# Patient Record
Sex: Male | Born: 1955
Health system: Southern US, Community
[De-identification: ages and names within clinical notes are randomized; demographics above are authoritative.]

## PROBLEM LIST (undated history)

## (undated) DIAGNOSIS — R7303 Prediabetes: Secondary | ICD-10-CM

## (undated) DIAGNOSIS — J349 Unspecified disorder of nose and nasal sinuses: Secondary | ICD-10-CM

## (undated) DIAGNOSIS — M1990 Unspecified osteoarthritis, unspecified site: Secondary | ICD-10-CM

## (undated) DIAGNOSIS — K219 Gastro-esophageal reflux disease without esophagitis: Secondary | ICD-10-CM

## (undated) DIAGNOSIS — I10 Essential (primary) hypertension: Secondary | ICD-10-CM

## (undated) DIAGNOSIS — Z860100 Personal history of colon polyps, unspecified: Secondary | ICD-10-CM

## (undated) DIAGNOSIS — K7460 Unspecified cirrhosis of liver: Secondary | ICD-10-CM

## (undated) DIAGNOSIS — F32A Depression, unspecified: Secondary | ICD-10-CM

## (undated) DIAGNOSIS — C801 Malignant (primary) neoplasm, unspecified: Secondary | ICD-10-CM

## (undated) DIAGNOSIS — E785 Hyperlipidemia, unspecified: Secondary | ICD-10-CM

## (undated) DIAGNOSIS — R519 Headache, unspecified: Secondary | ICD-10-CM

## (undated) DIAGNOSIS — F419 Anxiety disorder, unspecified: Secondary | ICD-10-CM

## (undated) DIAGNOSIS — J302 Other seasonal allergic rhinitis: Secondary | ICD-10-CM

## (undated) HISTORY — DX: Malignant (primary) neoplasm, unspecified: C80.1

## (undated) HISTORY — DX: Personal history of colon polyps, unspecified: Z86.0100

## (undated) HISTORY — DX: Unspecified osteoarthritis, unspecified site: M19.90

## (undated) HISTORY — DX: Gastro-esophageal reflux disease without esophagitis: K21.9

## (undated) HISTORY — DX: Hyperlipidemia, unspecified: E78.5

## (undated) HISTORY — DX: Essential (primary) hypertension: I10

## (undated) HISTORY — DX: Personal history of colonic polyps: Z86.010

## (undated) HISTORY — DX: Anxiety disorder, unspecified: F41.9

## (undated) HISTORY — DX: Unspecified disorder of nose and nasal sinuses: J34.9

## (undated) HISTORY — DX: Other seasonal allergic rhinitis: J30.2

---

## 1998-08-04 ENCOUNTER — Ambulatory Visit (HOSPITAL_COMMUNITY): Admission: RE | Admit: 1998-08-04 | Discharge: 1998-08-04 | Payer: Self-pay | Admitting: Family Medicine

## 1998-09-24 ENCOUNTER — Ambulatory Visit (HOSPITAL_COMMUNITY): Admission: RE | Admit: 1998-09-24 | Discharge: 1998-09-24 | Payer: Self-pay | Admitting: Family Medicine

## 2000-05-20 ENCOUNTER — Encounter: Admission: RE | Admit: 2000-05-20 | Discharge: 2000-05-20 | Payer: Self-pay | Admitting: Family Medicine

## 2000-05-20 ENCOUNTER — Encounter: Payer: Self-pay | Admitting: Family Medicine

## 2000-08-24 ENCOUNTER — Emergency Department (HOSPITAL_COMMUNITY): Admission: EM | Admit: 2000-08-24 | Discharge: 2000-08-24 | Payer: Self-pay

## 2000-11-04 ENCOUNTER — Encounter: Payer: Self-pay | Admitting: Family Medicine

## 2000-11-04 ENCOUNTER — Encounter: Admission: RE | Admit: 2000-11-04 | Discharge: 2000-11-04 | Payer: Self-pay | Admitting: Family Medicine

## 2002-01-16 ENCOUNTER — Ambulatory Visit (HOSPITAL_COMMUNITY): Admission: RE | Admit: 2002-01-16 | Discharge: 2002-01-16 | Payer: Self-pay | Admitting: Family Medicine

## 2002-01-16 ENCOUNTER — Encounter: Payer: Self-pay | Admitting: Family Medicine

## 2006-04-28 ENCOUNTER — Emergency Department (HOSPITAL_COMMUNITY): Admission: EM | Admit: 2006-04-28 | Discharge: 2006-04-28 | Payer: Self-pay | Admitting: Emergency Medicine

## 2007-02-08 ENCOUNTER — Encounter: Admission: RE | Admit: 2007-02-08 | Discharge: 2007-02-08 | Payer: Self-pay | Admitting: Family Medicine

## 2007-09-27 ENCOUNTER — Emergency Department (HOSPITAL_COMMUNITY): Admission: EM | Admit: 2007-09-27 | Discharge: 2007-09-27 | Payer: Self-pay | Admitting: Emergency Medicine

## 2007-09-27 IMAGING — CR DG RIBS W/ CHEST 3+V*L*
4 series · 4 of 4 positions shown · non-contrast
Comparison: none

HISTORY: Rib injury

[w chest pa]
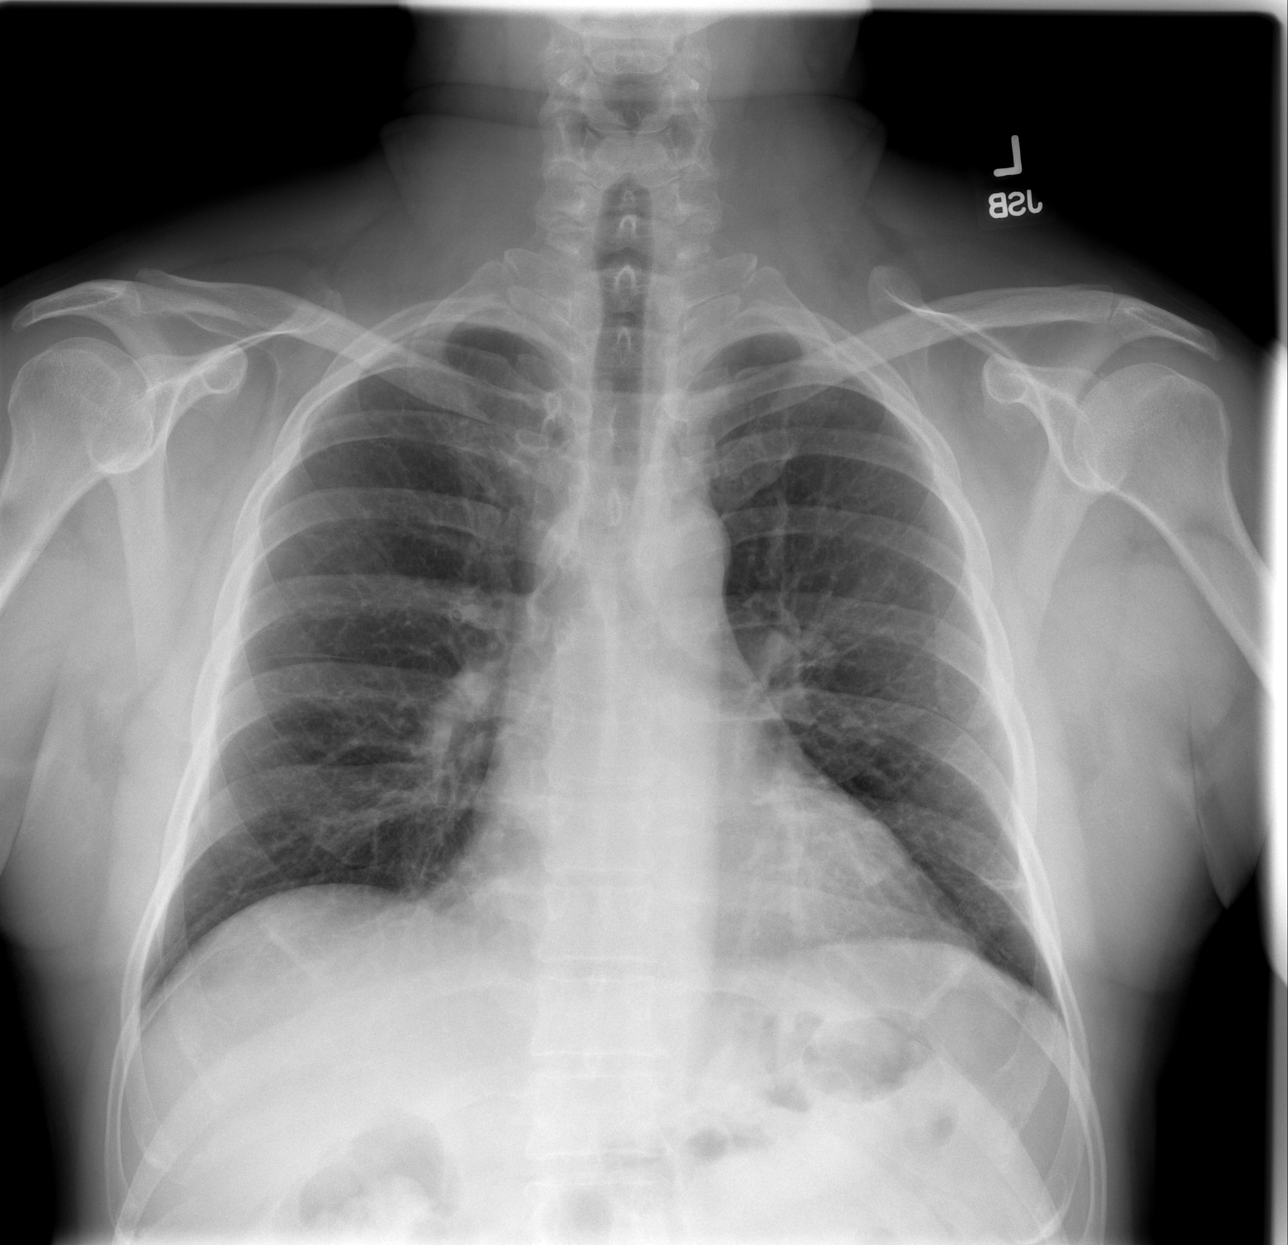

[w ribs ap/pa upper left]
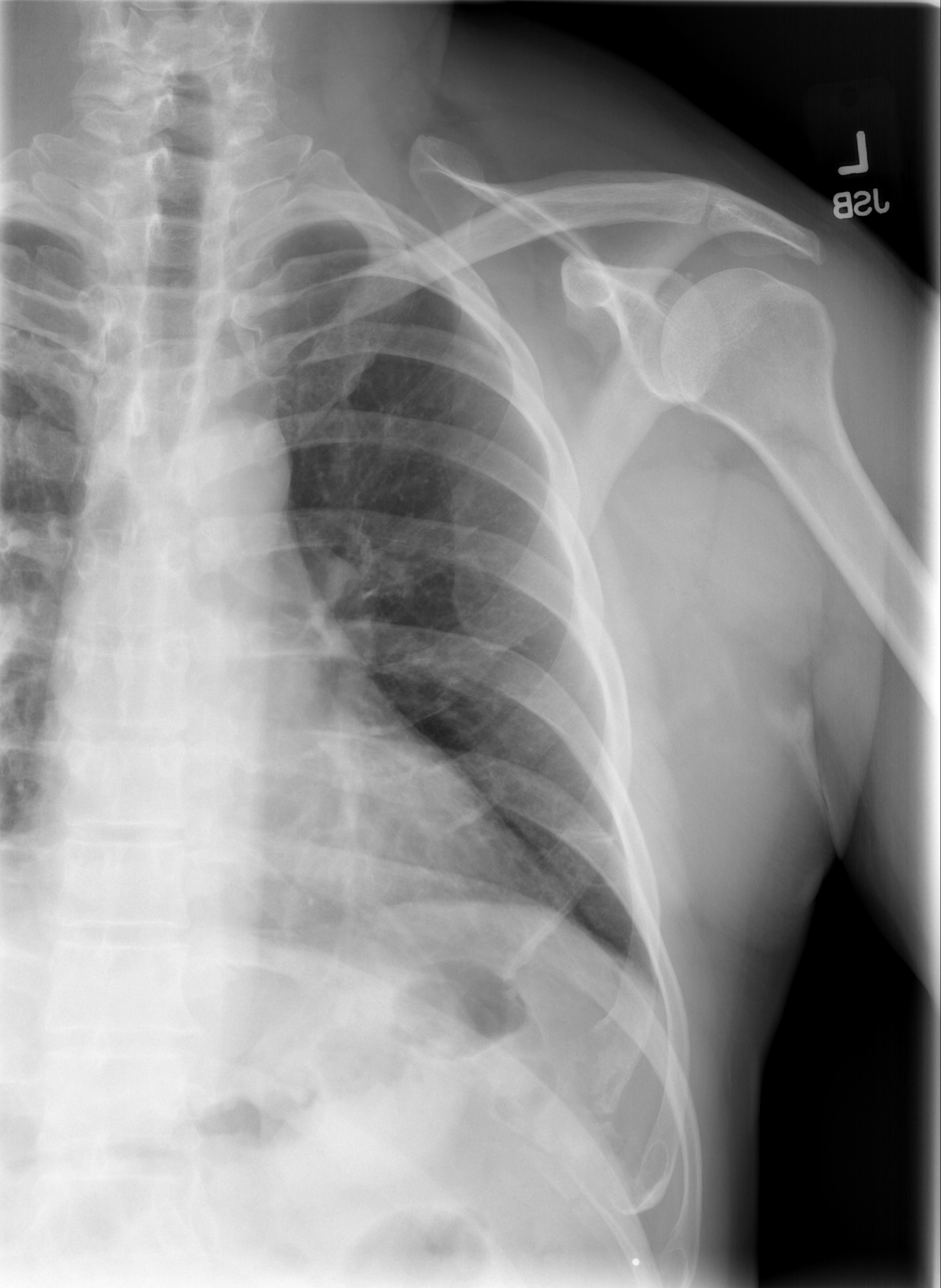

[w ribs oblique left]
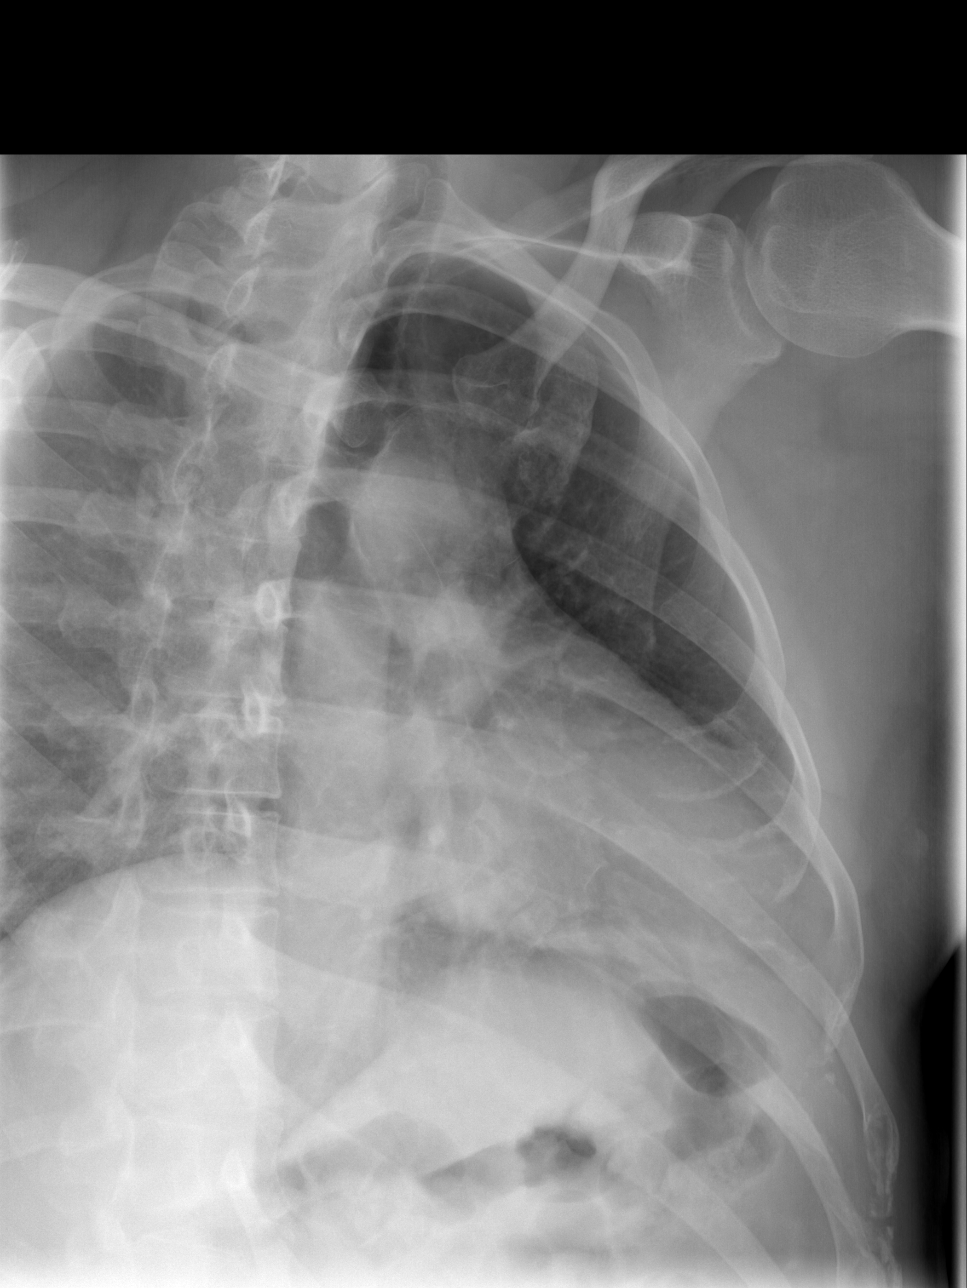

[w ribs ap/pa lower left]
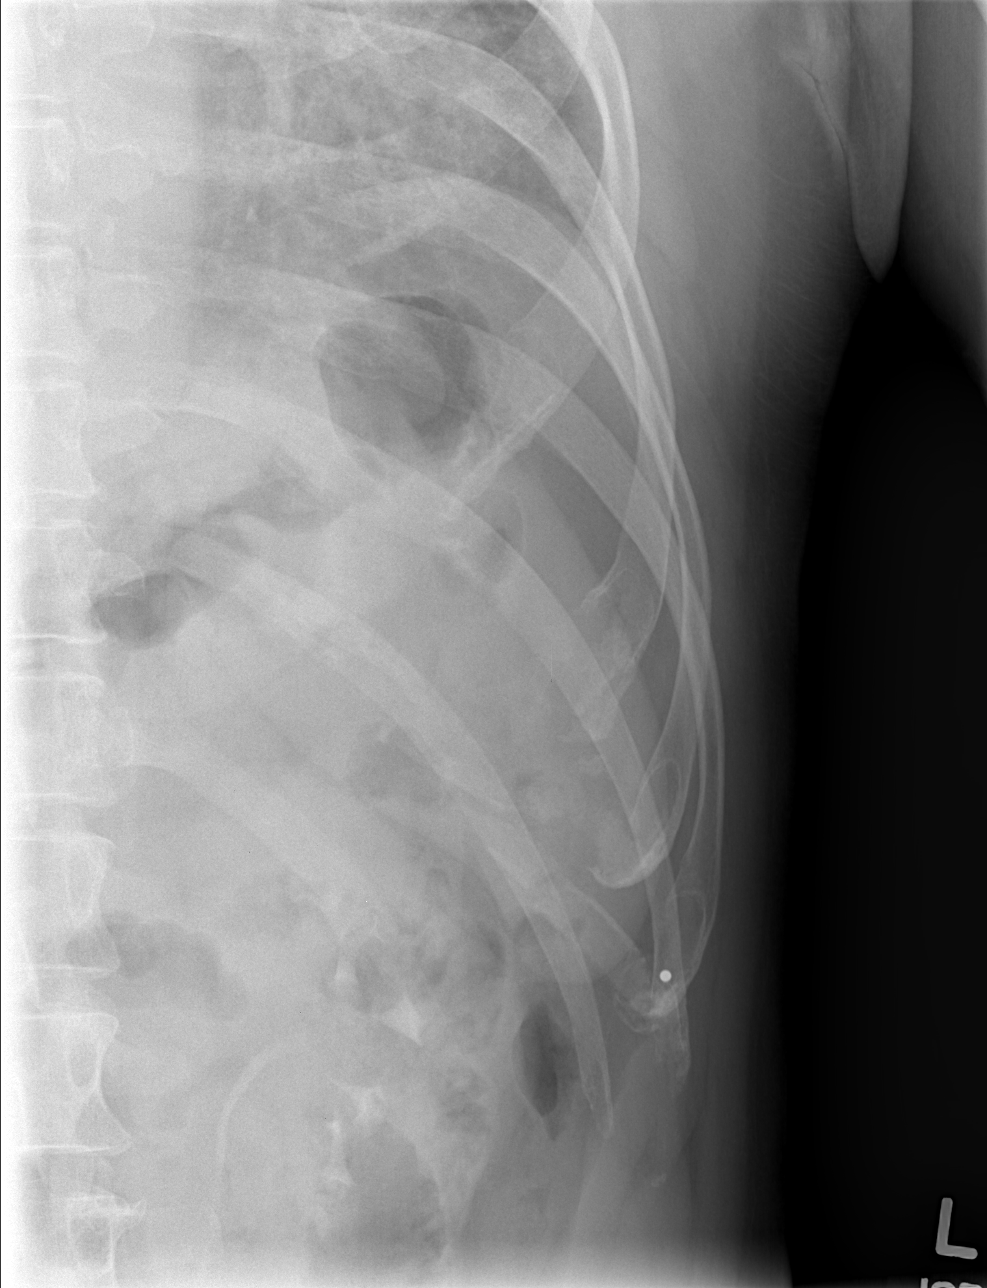

[4 of 4 positions shown; findings below may reference images not displayed]

LEFT RIBS WITH PA CHEST:

No prior exam for comparison.

Mild cardiac enlargement.
Normal mediastinal contours and pulmonary vascularity.
Minimal bronchitic changes with minimal subsegmental atelectasis left base.
No pulmonary infiltrate, pleural effusion, or pneumothorax.
Two views left ribs performed.
Nondisplaced fracture anterior left 9th rib, as noted on preceding CT.
No other bony abnormalities identified.
IMPRESSION: Nondisplaced fracture distal left 9th rib.
Minimal left basilar atelectasis and bronchitic changes.

## 2009-04-30 ENCOUNTER — Encounter (INDEPENDENT_AMBULATORY_CARE_PROVIDER_SITE_OTHER): Payer: Self-pay | Admitting: *Deleted

## 2009-04-30 ENCOUNTER — Emergency Department (HOSPITAL_COMMUNITY): Admission: EM | Admit: 2009-04-30 | Discharge: 2009-04-30 | Payer: Self-pay | Admitting: Emergency Medicine

## 2009-05-28 ENCOUNTER — Ambulatory Visit: Payer: Self-pay | Admitting: Gastroenterology

## 2009-05-28 DIAGNOSIS — I10 Essential (primary) hypertension: Secondary | ICD-10-CM | POA: Insufficient documentation

## 2009-05-28 DIAGNOSIS — M109 Gout, unspecified: Secondary | ICD-10-CM | POA: Insufficient documentation

## 2009-05-28 DIAGNOSIS — K5732 Diverticulitis of large intestine without perforation or abscess without bleeding: Secondary | ICD-10-CM | POA: Insufficient documentation

## 2009-05-29 ENCOUNTER — Ambulatory Visit: Payer: Self-pay | Admitting: Gastroenterology

## 2009-08-01 ENCOUNTER — Encounter: Payer: Self-pay | Admitting: Gastroenterology

## 2010-08-25 NOTE — Procedures (Signed)
Summary: Colonoscopy  Patient: Bobby Gamble Note: All result statuses are Final unless otherwise noted.  Tests: (1) Colonoscopy (COL)   COL Colonoscopy           DONE     [facility]     [address]     [place], [place]  [address]           COLONOSCOPY PROCEDURE REPORT           PATIENT:  Bobby Gamble, Bobby Gamble  MR#:  [id number]     BIRTHDATE:  Feb 19, 1956, 53 yrs. old  GENDER:  male           ENDOSCOPIST:  Barbette Hair. Arlyce Dice, MD     Referred by:           PROCEDURE DATE:  05/29/2009     PROCEDURE:  Colonoscopy, Diagnostic     ASA CLASS:  Class I     INDICATIONS:  diverticulitis           MEDICATIONS:   Fentanyl 100 mcg IV, Versed 13 mg IV, Benadryl 50     mg IV           DESCRIPTION OF PROCEDURE:   After the risks benefits and     alternatives of the procedure were thoroughly explained, informed     consent was obtained.  Digital rectal exam was performed and     revealed no abnormalities.   The LB CF-[id number] endoscope     was introduced through the anus and advanced to the cecum, which     was identified by both the appendix and ileocecal valve, without     limitations.  The quality of the prep was excellent, using     MoviPrep.  The instrument was then slowly withdrawn as the colon     was fully examined.     <<PROCEDUREIMAGES>>           FINDINGS:  Diverticula were found (see image9, image10, image11,     and image12). moderate diverticulosis in sigmoid  This was     otherwise a normal examination of the colon (see image2, image3,     image4, image5, image6, image7, image8, image13, image14, and     image15).   Retroflexed views in the rectum revealed no     abnormalities.    The scope was then withdrawn from the patient     and the procedure completed.           COMPLICATIONS:  None           ENDOSCOPIC IMPRESSION:     1) Diverticula     2) Otherwise normal examination     RECOMMENDATIONS:     1) Continue current colorectal screening  recommendations for     "routine risk" patients with a repeat colonoscopy in 10 years.           REPEAT EXAM:  In 10 year(s) for Colonoscopy.           ______________________________     Barbette Hair. Arlyce Dice, MD           CC:           n.     eSIGNED:   Barbette Hair. Curlee Bogan at 05/29/2009 04:14 PM           Sammuel Cooper, [id number]  Note: An exclamation mark (!) indicates a result that was not dispersed into the flowsheet. Document Creation Date: 05/29/2009 4:14 PM _______________________________________________________________________  (1) Order  result status: Final Collection or observation date-time: 05/29/2009 15:55 Requested date-time:  Receipt date-time:  Reported date-time:  Referring Physician:   Ordering Physician: Melvia Heaps [phone]) Specimen Source:  Source: Launa Grill Order Number: [id number] Lab site:   Appended Document: Colonoscopy    Clinical Lists Changes  Observations: Added new observation of COLONNXTDUE: 05/2019 (05/29/2009 16:19)

## 2010-08-25 NOTE — Assessment & Plan Note (Signed)
Summary: DIVERTICULITIES....EM   History of Present Illness Primary GI MD: Melvia Heaps MD [facility] Chief Complaint: F/u diverticulitis flare up. Pt is currently taking ATBs for flare. Pt has RLQ abd pain and constipation. Consult colon. History of Present Illness:   Bobby Gamble is a pleasant 55 year old white male referred from the ER for evaluation of abdominal pain.  In early October he presented to the ER with severe crampy lower abdominal pain.  CT Scan, which I reviewed, demonstrated inflammation in  the fat plane surrounding the sigmoid colon consistent with acute diverticulitis.  On Cipro and Flagyl his symptoms subsided though he did have significant anorexia and mild nausea with antibiotics.  He had a similar episode of abdominal pain one to 2 years ago.  In between he has felt well.  There is no history of rectal bleeding or change in bowel habits.   GI Review of Systems    Reports abdominal pain, acid reflux, belching, and  bloating.     Location of  Abdominal pain: RLQ.    Denies chest pain, dysphagia with liquids, dysphagia with solids, heartburn, loss of appetite, nausea, vomiting, vomiting blood, weight loss, and  weight gain.      Reports constipation, diverticulosis, and  hemorrhoids.     Denies anal fissure, black tarry stools, change in bowel habit, diarrhea, fecal incontinence, heme positive stool, irritable bowel syndrome, jaundice, light color stool, liver problems, rectal bleeding, and  rectal pain. Preventive Screening-Counseling & Management  Alcohol-Tobacco     Smoking Status: never      Drug Use:  no.      Current Medications (verified): 1)  Allopurinol 100 Mg Tabs (Allopurinol) .... One Tablet By Mouth Once Daily 2)  Nexium 40 Mg Cpdr (Esomeprazole Magnesium) .... One Tablet By Mouth Once Daily 3)  Lipofen 50 Mg Caps (Fenofibrate) .... One Tablet By Mouth Once Daily 4)  Lisinopril 10 Mg Tabs (Lisinopril) .... One Tablet By Mouth Once Daily 5)  Xanax 0.5 Mg  Tabs (Alprazolam) .... One Tablet By Mouth As Needed 6)  Temazepam 15 Mg Caps (Temazepam) .... One Tablet By Mouth At Bedtime  Allergies (verified): 1)  ! 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  Alcohol Use - yes Daily Caffeine Use Illicit Drug Use - no Smoking Status:  never Drug Use:  no  Review of Systems       The patient complains of anxiety-new and cough.  The patient denies allergy/sinus, anemia, arthritis/joint pain, back pain, blood in urine, breast changes/lumps, change in vision, confusion, coughing up blood, depression-new, fainting, fatigue, fever, headaches-new, hearing problems, heart murmur, heart rhythm changes, itching, menstrual pain, muscle pains/cramps, night sweats, nosebleeds, pregnancy symptoms, shortness of breath, skin rash, sleeping problems, sore throat, swelling of feet/legs, swollen lymph glands, thirst - excessive , urination - excessive , urination changes/pain, urine leakage, vision changes, and voice change.         All other systems were reviewed and were negative   Vital Signs:  Patient profile:   55 year old male Height:      66 inches Weight:      178 pounds BMI:     28.83 Pulse rate:   72 / minute Pulse rhythm:   regular BP sitting:   136 / 90  (left arm) Cuff size:   regular  Vitals Entered By: Bobby Gamble CMA (AAMA) (May 28, 2009 11:07 AM)  Physical Exam  Additional Exam:  He is a healthy-appearing male  skin: anicteric HEENT: normocephalic; PEERLA; no nasal or pharyngeal abnormalities neck: supple nodes: no cervical lymphadenopathy chest: clear to ausculatation and percussion heart: no murmurs, gallops, or rubs abd: soft, nontender; BS normoactive; no abdominal masses,  tenderness, organomegaly rectal: deferred ext: no cynanosis, clubbing, edema skeletal: no deformities neuro: oriented x 3; no focal abnormalities    Impression & Recommendations:  Problem # 1:  DIVERTICULITIS OF COLON (ICD-562.11) Assessment Improved  Patient has had 2 discrete episodes of diverticulitis.  We began a conversation about potential elective surgery if diverticulitis should recur.  Colonoscopy was recommended both for further evaluation and for colorectal cancer screening.  Orders: Colonoscopy (Colon)  Problem # 2:  GOUT, UNSPECIFIED (ICD-274.9) Assessment: Comment Only  Problem # 3:  ESSENTIAL HYPERTENSION, BENIGN (ICD-401.1) Assessment: Comment Only  Patient Instructions: 1)  Constipation and Hemorrhoids brochure given.  2)  Colonoscopy and Flexible Sigmoidoscopy brochure given.  3)  Your colonoscopy is scheduled for 05/29/2009 at 3pm 4)  You can pick up your MoviPrep from your pharmacy today 5)  cc Dr. Hulan Saas 6)  The medication list was reviewed and reconciled.  All changed / newly prescribed medications were explained.  A complete medication list was provided to the patient / caregiver. Prescriptions: MOVIPREP 100 GM  SOLR (PEG-KCL-NACL-NASULF-NA ASC-C) As per prep instructions.  #1 x 0   Entered by:   Bobby Gamble CMA (AAMA)   Authorized by:   Louis Meckel MD   Signed by:   Bobby Gamble CMA (AAMA) on 05/28/2009   Method used:   Electronically to        [facility]  [address] (retail)       [address]       [place], [place]  [address]       Ph: [phone] or [phone]       Fax: [phone]   RxID:   [id number]

## 2010-08-25 NOTE — Letter (Signed)
Summary: [facility]   Imported By: Sherian Rein 09/26/2009 12:03:49  _____________________________________________________________________  External Attachment:    Type:   Image     Comment:   External Document

## 2010-08-25 NOTE — Letter (Signed)
Summary: Results Letter  [facility]  [address] [place], [place] [address]   Phone: [phone]  Fax: [phone]        May 28, 2009 MRN: [id number]    Bobby Gamble [address] [place], [place]  [address]    Dear [name],  It is my pleasure to have treated you recently as a new patient in my office. I appreciate your confidence and the opportunity to participate in your care.  Since I do have a busy inpatient endoscopy schedule and office schedule, my office hours vary weekly. I am, however, available for emergency calls everyday through my office. If I am not available for an urgent office appointment, another one of our gastroenterologist will be able to assist you.  My well-trained staff are prepared to help you at all times. For emergencies after office hours, a physician from our Gastroenterology section is always available through my 24 hour answering service  Once again I welcome you as a new patient and I look forward to a happy and healthy relationship             Sincerely,  Louis Meckel MD  This letter has been electronically signed by your physician.  Appended Document: Results Letter Letter mailed to patient.

## 2010-08-25 NOTE — Letter (Signed)
Summary: [facility] Instructions  [facility]  [address] [place], [place] [address]   Phone: [phone]  Fax: [phone]       Bobby Gamble    05/22/1956    MRN: [id number]        Procedure Day /Date:THURSDAY 05/29/2009     Arrival Time:2:00PM     Procedure Time:3:00PM     Location of Procedure:                    X  [facility] (4th Floor)                        PREPARATION FOR COLONOSCOPY WITH MOVIPREP   Starting 5 days prior to your procedure TODAY do not eat nuts, seeds, popcorn, corn, beans, peas,  salads, or any raw vegetables.  Do not take any fiber supplements (e.g. Metamucil, Citrucel, and Benefiber).  THE DAY BEFORE YOUR PROCEDURE         DATE: 05/28/2009  ONG:EXBMWUXLK  1.  Drink clear liquids the entire day-NO SOLID FOOD  2.  Do not drink anything colored red or purple.  Avoid juices with pulp.  No orange juice.  3.  Drink at least 64 oz. (8 glasses) of fluid/clear liquids during the day to prevent dehydration and help the prep work efficiently.  CLEAR LIQUIDS INCLUDE: Water Jello Ice Popsicles Tea (sugar ok, no milk/cream) Powdered fruit flavored drinks Coffee (sugar ok, no milk/cream) Gatorade Juice: apple, white grape, white cranberry  Lemonade Clear bullion, consomm, broth Carbonated beverages (any kind) Strained chicken noodle soup Hard Candy                             4.  In the morning, mix first dose of MoviPrep solution:    Empty 1 Pouch A and 1 Pouch B into the disposable container    Add lukewarm drinking water to the top line of the container. Mix to dissolve    Refrigerate (mixed solution should be used within 24 hrs)  5.  Begin drinking the prep at 5:00 p.m. The MoviPrep container is divided by 4 marks.   Every 15 minutes drink the solution down to the next mark (approximately 8 oz) until the full liter is complete.   6.  Follow completed prep with 16 oz of clear liquid of your choice (Nothing  red or purple).  Continue to drink clear liquids until bedtime.  7.  Before going to bed, mix second dose of MoviPrep solution:    Empty 1 Pouch A and 1 Pouch B into the disposable container    Add lukewarm drinking water to the top line of the container. Mix to dissolve    Refrigerate  THE DAY OF YOUR PROCEDURE      DATE: 05/29/2009 DAY: THURSDAY  Beginning at 10:00a.m. (5 hours before procedure):         1. Every 15 minutes, drink the solution down to the next mark (approx 8 oz) until the full liter is complete.  2. Follow completed prep with 16 oz. of clear liquid of your choice.    3. You may drink clear liquids until 1:PM (2 HOURS BEFORE PROCEDURE).   MEDICATION INSTRUCTIONS  Unless otherwise instructed, you should take regular prescription medications with a small sip of water   as early as possible the morning of your procedure.  OTHER INSTRUCTIONS  You will need a responsible adult at least 53 years of age to accompany you and drive you home.   This person must remain in the waiting room during your procedure.  Wear loose fitting clothing that is easily removed.  Leave jewelry and other valuables at home.  However, you may wish to bring a book to read or  an iPod/MP3 player to listen to music as you wait for your procedure to start.  Remove all body piercing jewelry and leave at home.  Total time from sign-in until discharge is approximately 2-3 hours.  You should go home directly after your procedure and rest.  You can resume normal activities the  day after your procedure.  The day of your procedure you should not:   Drive   Make legal decisions   Operate machinery   Drink alcohol   Return to work  You will receive specific instructions about eating, activities and medications before you leave.    The above instructions have been reviewed and explained to me by   _______________________    I fully understand and can verbalize  these instructions _____________________________ Date _________

## 2010-10-29 LAB — POCT I-STAT, CHEM 8
BUN: 17 mg/dL (ref 6–23)
Calcium, Ion: 1.18 mmol/L (ref 1.12–1.32)
Chloride: 106 mEq/L (ref 96–112)
Creatinine, Ser: 1.3 mg/dL (ref 0.4–1.5)
Glucose, Bld: 117 mg/dL — ABNORMAL HIGH (ref 70–99)
HCT: 45 % (ref 39.0–52.0)
Hemoglobin: 15.3 g/dL (ref 13.0–17.0)
Potassium: 4.3 mEq/L (ref 3.5–5.1)
Sodium: 140 mEq/L (ref 135–145)
TCO2: 23 mmol/L (ref 0–100)

## 2010-10-29 LAB — CBC
HCT: 43.6 % (ref 39.0–52.0)
Hemoglobin: 15.3 g/dL (ref 13.0–17.0)
MCHC: 35.2 g/dL (ref 30.0–36.0)
MCV: 99.7 fL (ref 78.0–100.0)
Platelets: 188 10*3/uL (ref 150–400)
RBC: 4.37 MIL/uL (ref 4.22–5.81)
RDW: 12.3 % (ref 11.5–15.5)
WBC: 14.4 10*3/uL — ABNORMAL HIGH (ref 4.0–10.5)

## 2010-10-29 LAB — URINALYSIS, ROUTINE W REFLEX MICROSCOPIC
Bilirubin Urine: NEGATIVE
Glucose, UA: NEGATIVE mg/dL
Hgb urine dipstick: NEGATIVE
Ketones, ur: NEGATIVE mg/dL
Nitrite: NEGATIVE
Protein, ur: NEGATIVE mg/dL
Specific Gravity, Urine: 1.019 (ref 1.005–1.030)
Urobilinogen, UA: 0.2 mg/dL (ref 0.0–1.0)
pH: 7.5 (ref 5.0–8.0)

## 2010-10-29 LAB — DIFFERENTIAL
Basophils Absolute: 0 10*3/uL (ref 0.0–0.1)
Basophils Relative: 0 % (ref 0–1)
Eosinophils Absolute: 0.1 10*3/uL (ref 0.0–0.7)
Eosinophils Relative: 1 % (ref 0–5)
Lymphocytes Relative: 17 % (ref 12–46)
Lymphs Abs: 2.4 10*3/uL (ref 0.7–4.0)
Monocytes Absolute: 1 10*3/uL (ref 0.1–1.0)
Monocytes Relative: 7 % (ref 3–12)
Neutro Abs: 10.9 10*3/uL — ABNORMAL HIGH (ref 1.7–7.7)
Neutrophils Relative %: 75 % (ref 43–77)

## 2010-10-29 LAB — BASIC METABOLIC PANEL
BUN: 15 mg/dL (ref 6–23)
CO2: 25 mEq/L (ref 19–32)
Calcium: 9.5 mg/dL (ref 8.4–10.5)
Chloride: 104 mEq/L (ref 96–112)
Creatinine, Ser: 1.36 mg/dL (ref 0.4–1.5)
GFR calc Af Amer: >60 mL/min (ref >60)
GFR calc non Af Amer: 55 mL/min — ABNORMAL LOW (ref >60)
Glucose, Bld: 119 mg/dL — ABNORMAL HIGH (ref 70–99)
Potassium: 4.2 mEq/L (ref 3.5–5.1)
Sodium: 138 mEq/L (ref 135–145)

## 2011-04-14 ENCOUNTER — Ambulatory Visit (HOSPITAL_BASED_OUTPATIENT_CLINIC_OR_DEPARTMENT_OTHER)
Admission: RE | Admit: 2011-04-14 | Discharge: 2011-04-14 | Disposition: A | Discharge status: Home / Self Care | Payer: 59 | Source: Ambulatory Visit | Attending: Urology | Admitting: Urology

## 2011-04-14 ENCOUNTER — Other Ambulatory Visit: Payer: Self-pay | Admitting: Urology

## 2011-04-14 DIAGNOSIS — R972 Elevated prostate specific antigen [PSA]: Secondary | ICD-10-CM | POA: Insufficient documentation

## 2011-04-14 DIAGNOSIS — C61 Malignant neoplasm of prostate: Secondary | ICD-10-CM | POA: Insufficient documentation

## 2011-04-14 DIAGNOSIS — Z01810 Encounter for preprocedural cardiovascular examination: Secondary | ICD-10-CM | POA: Insufficient documentation

## 2011-04-14 DIAGNOSIS — Z01812 Encounter for preprocedural laboratory examination: Secondary | ICD-10-CM | POA: Insufficient documentation

## 2011-04-14 DIAGNOSIS — I10 Essential (primary) hypertension: Secondary | ICD-10-CM | POA: Insufficient documentation

## 2011-04-14 DIAGNOSIS — K219 Gastro-esophageal reflux disease without esophagitis: Secondary | ICD-10-CM | POA: Insufficient documentation

## 2011-04-14 LAB — POCT I-STAT 4, (NA,K, GLUC, HGB,HCT)
Glucose, Bld: 116 mg/dL — ABNORMAL HIGH (ref 70–99)
HCT: 44 % (ref 39.0–52.0)
Hemoglobin: 15 g/dL (ref 13.0–17.0)
Potassium: 4.3 mEq/L (ref 3.5–5.1)
Sodium: 141 mEq/L (ref 135–145)

## 2011-04-15 NOTE — Op Note (Signed)
  Bobby Gamble, CLAUSON            ACCOUNT NO.:  [id number]  MEDICAL RECORD NO.:  [id number]  LOCATION:                               FACILITY:  [facility]  PHYSICIAN:  Valetta Fuller, MD    DATE OF BIRTH:  18-Feb-1956  DATE OF PROCEDURE: DATE OF DISCHARGE:                              OPERATIVE REPORT   PREOPERATIVE DIAGNOSIS:  Elevated PSA.  POSTOPERATIVE DIAGNOSIS:  Elevated PSA.  PROCEDURE PERFORMED:  Transrectal ultrasound of the prostate with ultrasound-guided biopsy x12.  SURGEON:  Valetta Fuller, MD  ANESTHESIA:  General.  INDICATIONS:  [name] is 55 years of age.  He was sent to see me for a PSA that has been steadily rising.  The patient's PSA in 2011 was 2.5.  In May of this year his PSA increased to 4.7 and was 5.3 when checked in August.  At that point, he was sent to [place] for further assessment.  Rectal exam revealed a small unremarkable prostate.  The patient had considerable discomfort from a rectal exam and also considerable anxiety.  For that reason, when we discussed the rationale for consideration of ultrasound biopsy of the prostate, the patient requested additional anesthetic rather than just office procedure.  We felt that given his degree of discomfort and anxiety that was quite a reasonable request.  The patient has performed Fleets enemas and has taken oral Levaquin.  He has received perioperative gentamicin in the holding area.  Full informed consent has been obtained.  TECHNIQUE AND FINDINGS:  The patient was brought to the operating room where he had successful induction of general anesthesia.  He was placed in lateral decubitus position.  Transrectal ultrasound probe was placed. Representative pictures in the transverse and sagittal dimensions were taken.  The prostate was measured at 15 g.  The prostate and seminal vesicles were symmetric and unremarkable.  There were no other abnormalities appreciated.  Twelve biopsies were done in the  standard manner with each core sent in a separate vial.  There were no obvious complications or problems and the patient was brought to recovery room in stable condition.     Valetta Fuller, MD     DSG/MEDQ  D:  04/14/2011  T:  04/14/2011  Job:  [id number]  Electronically Signed by Barron Alvine M.D. on 04/15/2011 04:54:37 PM

## 2011-04-19 LAB — CBC
HCT: 47.3
Hemoglobin: 16.8
MCHC: 35.4
MCV: 96.8
Platelets: 208
RBC: 4.89
RDW: 11.7
WBC: 8.9

## 2011-04-19 LAB — HEPATIC FUNCTION PANEL
ALT: 51
AST: 32
Albumin: 4.4
Alkaline Phosphatase: 63
Bilirubin, Direct: 0.2
Indirect Bilirubin: 0.6
Total Bilirubin: 0.8
Total Protein: 7.8

## 2011-04-19 LAB — I-STAT 8, (EC8 V) (CONVERTED LAB)
Acid-Base Excess: 3 — ABNORMAL HIGH
BUN: 17
Bicarbonate: 29.2 — ABNORMAL HIGH
Chloride: 106
Glucose, Bld: 96
HCT: 49
Hemoglobin: 16.7
Operator id: 234501
Potassium: 4.5
Sodium: 137
TCO2: 31
pCO2, Ven: 50.7 — ABNORMAL HIGH
pH, Ven: 7.368 — ABNORMAL HIGH

## 2011-04-19 LAB — POCT I-STAT CREATININE
Creatinine, Ser: 1.4
Operator id: 234501

## 2011-04-19 LAB — DIFFERENTIAL
Basophils Absolute: 0
Basophils Relative: 1
Eosinophils Absolute: 0.3
Eosinophils Relative: 4
Lymphocytes Relative: 34
Lymphs Abs: 3
Monocytes Absolute: 0.8
Monocytes Relative: 9
Neutro Abs: 4.8
Neutrophils Relative %: 54

## 2011-04-19 LAB — LIPASE, BLOOD: Lipase: 41

## 2011-05-31 ENCOUNTER — Encounter (HOSPITAL_COMMUNITY): Payer: Self-pay | Admitting: Pharmacy Technician

## 2011-06-01 ENCOUNTER — Encounter (HOSPITAL_COMMUNITY): Payer: Self-pay

## 2011-06-01 ENCOUNTER — Ambulatory Visit (HOSPITAL_COMMUNITY)
Admission: RE | Admit: 2011-06-01 | Discharge: 2011-06-01 | Disposition: A | Discharge status: Home / Self Care | Payer: 59 | Source: Ambulatory Visit | Attending: Urology | Admitting: Urology

## 2011-06-01 ENCOUNTER — Encounter (HOSPITAL_COMMUNITY): Payer: 59

## 2011-06-01 DIAGNOSIS — F419 Anxiety disorder, unspecified: Secondary | ICD-10-CM

## 2011-06-01 DIAGNOSIS — C801 Malignant (primary) neoplasm, unspecified: Secondary | ICD-10-CM

## 2011-06-01 DIAGNOSIS — K219 Gastro-esophageal reflux disease without esophagitis: Secondary | ICD-10-CM

## 2011-06-01 DIAGNOSIS — Z01812 Encounter for preprocedural laboratory examination: Secondary | ICD-10-CM | POA: Insufficient documentation

## 2011-06-01 DIAGNOSIS — E785 Hyperlipidemia, unspecified: Secondary | ICD-10-CM

## 2011-06-01 DIAGNOSIS — M1990 Unspecified osteoarthritis, unspecified site: Secondary | ICD-10-CM

## 2011-06-01 DIAGNOSIS — Z01818 Encounter for other preprocedural examination: Secondary | ICD-10-CM | POA: Insufficient documentation

## 2011-06-01 DIAGNOSIS — J349 Unspecified disorder of nose and nasal sinuses: Secondary | ICD-10-CM

## 2011-06-01 HISTORY — DX: Gastro-esophageal reflux disease without esophagitis: K21.9

## 2011-06-01 HISTORY — DX: Anxiety disorder, unspecified: F41.9

## 2011-06-01 HISTORY — DX: Unspecified disorder of nose and nasal sinuses: J34.9

## 2011-06-01 HISTORY — DX: Unspecified osteoarthritis, unspecified site: M19.90

## 2011-06-01 HISTORY — DX: Hyperlipidemia, unspecified: E78.5

## 2011-06-01 HISTORY — PX: WISDOM TOOTH EXTRACTION: SHX21

## 2011-06-01 HISTORY — PX: MANDIBLE FRACTURE SURGERY: SHX706

## 2011-06-01 HISTORY — DX: Malignant (primary) neoplasm, unspecified: C80.1

## 2011-06-01 LAB — BASIC METABOLIC PANEL
BUN: 22 mg/dL (ref 6–23)
CO2: 27 mEq/L (ref 19–32)
Calcium: 10.4 mg/dL (ref 8.4–10.5)
Chloride: 101 mEq/L (ref 96–112)
Creatinine, Ser: 1.3 mg/dL (ref 0.50–1.35)
GFR calc Af Amer: 71 mL/min — ABNORMAL LOW (ref >90)
GFR calc non Af Amer: 61 mL/min — ABNORMAL LOW (ref >90)
Glucose, Bld: 101 mg/dL — ABNORMAL HIGH (ref 70–99)
Potassium: 4.1 mEq/L (ref 3.5–5.1)
Sodium: 138 mEq/L (ref 135–145)

## 2011-06-01 LAB — CBC
HCT: 39.9 % (ref 39.0–52.0)
Hemoglobin: 14.5 g/dL (ref 13.0–17.0)
MCH: 33.8 pg (ref 26.0–34.0)
MCHC: 36.3 g/dL — ABNORMAL HIGH (ref 30.0–36.0)
MCV: 93 fL (ref 78.0–100.0)
Platelets: 191 10*3/uL (ref 150–400)
RBC: 4.29 MIL/uL (ref 4.22–5.81)
RDW: 12.7 % (ref 11.5–15.5)
WBC: 7.9 10*3/uL (ref 4.0–10.5)

## 2011-06-01 LAB — SURGICAL PCR SCREEN
MRSA, PCR: INVALID — AB
Staphylococcus aureus: INVALID — AB

## 2011-06-01 NOTE — Pre-Procedure Instructions (Signed)
EKG 04-14-11 with chart in epic. CXR to be done 06-01-11.

## 2011-06-01 NOTE — Patient Instructions (Signed)
20 Bobby Gamble  06/01/2011   Your procedure is scheduled on: 06-09-11  Report to [facility] at 0630 AM.  Call this number if you have problems the morning of surgery: [phone]   Remember:   Do not eat food:After Midnight.  Do not drink clear liquids: After Midnight.  Take these medicines the morning of surgery with A SIP OF WATER: Prilosec, Allopurinol, Lipofen,Xanax.   Do not wear jewelry, make-up or nail polish.  Do not wear lotions, powders, or perfumes. You may wear deodorant.  Do not shave 48 hours prior to surgery.  Do not bring valuables to the hospital.  Contacts, dentures or bridgework may not be worn into surgery.  Leave suitcase in the car. After surgery it may be brought to your room.  For patients admitted to the hospital, checkout time is 11:00 AM the day of discharge.   Patients discharged the day of surgery will not be allowed to drive home.  Name and phone number of your driver: Dianne XBMWUXLK,[phone]  Special Instructions: CHG Shower Use Special Wash: 1/2 bottle night before surgery and 1/2 bottle morning of surgery.   Please read over the following fact sheets that you were given: Blood Transfusion Information and MRSA Information. Pt. Reminded of home bowel prep per Dr. Ellin Goodie office.

## 2011-06-04 LAB — MRSA CULTURE

## 2011-06-08 NOTE — H&P (Signed)
Urology Admission H&P  Chief Complaint:Prostate cancer for RLRRP/ BPLND.  History of Present Illness: The patient presents today for robotic-assisted laparoscopic radical retropubic prostatectomy and bilateral pelvic lymph node dissection. This has been diagnosed with intermediate risk clinical stage TI C. adenocarcinoma of the prostate. Patient was asymptomatic and an elevated PSA of 5.3. Digital rectal exam was unremarkable. Ultrasound revealed about a 15 g prostate. Also biopsies were negative. On the right 5/6 biopsy cores were positive for Gleason's 3+4 equals 7 cancer.  Past Medical History  Diagnosis Date  . Hypertension   . Sinus disorder 06-01-11    allergies chronic/ sinus issues  . GERD (gastroesophageal reflux disease) 06-01-11    tx. Prilosec  . Cancer 06-01-11    Bx.  with dx. 5 weeks ago-Prostate   . Arthritis 06-01-11    hx. Gout-tx. Allopurinol  . Anxiety 06-01-11    tx. Xanax  . Hyperlipidemia 06-01-11   Past Surgical History  Procedure Date  . Mandible fracture surgery 06-01-11    Rt. lower jaw-retained hardware.  . Wisdom tooth extraction 06-01-11    all    Home Medications:  No prescriptions prior to admission   Allergies:  Allergies  Allergen Reactions  . Codeine Nausea And Vomiting    No family history on file. Social History:  does not have a smoking history on file. He does not have any smokeless tobacco history on file. He reports that he drinks about 3.6 ounces of alcohol per week. His drug history not on file.  Review of Systems  Genitourinary: Positive for frequency.  Musculoskeletal: Positive for back pain.  Psychiatric/Behavioral: The patient is nervous/anxious.   All other systems reviewed and are negative.    Physical Exam:  Vital signs in last 24 hours:   Physical Exam  Constitutional: He is oriented to person, place, and time. He appears well-developed and well-nourished.  HENT:  Head: Normocephalic.  Neck: Neck supple.    Cardiovascular: Normal rate and regular rhythm.   Respiratory: Effort normal and breath sounds normal. No respiratory distress.  GI: Soft. He exhibits no distension. There is no tenderness.  Genitourinary: Rectum normal, prostate normal and penis normal.  Musculoskeletal: Normal range of motion.  Neurological: He is alert and oriented to person, place, and time.  Skin: Skin is warm and dry.    Laboratory Data:  No results found for this or any previous visit (from the past 24 hour(s)). Recent Results (from the past 240 hour(s))  SURGICAL PCR SCREEN     Status: Abnormal   Collection Time   06/01/11  3:47 PM      Component Value Range Status Comment   MRSA, PCR INVALID RESULTS, SPECIMEN SENT FOR CULTURE (*) NEGATIVE  Final    Staphylococcus aureus INVALID RESULTS, SPECIMEN SENT FOR CULTURE (*) NEGATIVE  Final   MRSA CULTURE     Status: Normal   Collection Time   06/01/11  3:55 PM      Component Value Range Status Comment   Specimen Description NOSE   Final    Special Requests NONE   Final    Culture NO STAPHYLOCOCCUS AUREUS ISOLATED   Final    Report Status 06/04/2011 FINAL   Final    Creatinine: No results found for this basename: CREATININE:7 in the last 168 hours Baseline Creatinine:   Impression/Assessment:  Prostate cancer  Plan:  RLRRP/ B PLND  Melissa Tomaselli S 06/08/2011, 8:37 PM

## 2011-06-09 ENCOUNTER — Encounter (HOSPITAL_COMMUNITY): Payer: Self-pay | Admitting: *Deleted

## 2011-06-09 ENCOUNTER — Encounter (HOSPITAL_COMMUNITY): Payer: Self-pay | Admitting: Anesthesiology

## 2011-06-09 ENCOUNTER — Other Ambulatory Visit: Payer: Self-pay | Admitting: Urology

## 2011-06-09 ENCOUNTER — Inpatient Hospital Stay (HOSPITAL_COMMUNITY): Payer: 59 | Admitting: Anesthesiology

## 2011-06-09 ENCOUNTER — Inpatient Hospital Stay (HOSPITAL_COMMUNITY)
Admission: RE | Admit: 2011-06-09 | Discharge: 2011-06-10 | DRG: 708 | Disposition: A | Discharge status: Home / Self Care | Payer: 59 | Source: Ambulatory Visit | Attending: Urology | Admitting: Urology

## 2011-06-09 ENCOUNTER — Encounter (HOSPITAL_COMMUNITY)
Admission: RE | Disposition: A | Discharge status: Home / Self Care | Payer: Self-pay | Source: Ambulatory Visit | Attending: Urology

## 2011-06-09 DIAGNOSIS — I10 Essential (primary) hypertension: Secondary | ICD-10-CM | POA: Diagnosis present

## 2011-06-09 DIAGNOSIS — E785 Hyperlipidemia, unspecified: Secondary | ICD-10-CM | POA: Diagnosis present

## 2011-06-09 DIAGNOSIS — K219 Gastro-esophageal reflux disease without esophagitis: Secondary | ICD-10-CM | POA: Diagnosis present

## 2011-06-09 DIAGNOSIS — C61 Malignant neoplasm of prostate: Principal | ICD-10-CM | POA: Diagnosis present

## 2011-06-09 DIAGNOSIS — F411 Generalized anxiety disorder: Secondary | ICD-10-CM | POA: Diagnosis present

## 2011-06-09 DIAGNOSIS — M129 Arthropathy, unspecified: Secondary | ICD-10-CM | POA: Diagnosis present

## 2011-06-09 HISTORY — PX: ROBOT ASSISTED LAPAROSCOPIC RADICAL PROSTATECTOMY: SHX5141

## 2011-06-09 LAB — HEMOGLOBIN AND HEMATOCRIT, BLOOD
HCT: 35.3 % — ABNORMAL LOW (ref 39.0–52.0)
Hemoglobin: 12.8 g/dL — ABNORMAL LOW (ref 13.0–17.0)

## 2011-06-09 LAB — TYPE AND SCREEN
ABO/RH(D): O POS
Antibody Screen: NEGATIVE

## 2011-06-09 LAB — ABO/RH: ABO/RH(D): O POS

## 2011-06-09 SURGERY — ROBOTIC ASSISTED LAPAROSCOPIC RADICAL PROSTATECTOMY
Anesthesia: General | Site: Abdomen | Laterality: Bilateral | Wound class: Clean Contaminated

## 2011-06-09 MED ORDER — EPHEDRINE SULFATE 50 MG/ML IJ SOLN
INTRAMUSCULAR | Status: DC | PRN
  Administered 2011-06-09: 10 mg via INTRAVENOUS

## 2011-06-09 MED ORDER — LIDOCAINE HCL (CARDIAC) 20 MG/ML IV SOLN
INTRAVENOUS | Status: DC | PRN
  Administered 2011-06-09: 60 mg via INTRAVENOUS

## 2011-06-09 MED ORDER — LACTATED RINGERS IV SOLN
INTRAVENOUS | Status: DC | PRN
  Administered 2011-06-09 (×2): via INTRAVENOUS

## 2011-06-09 MED ORDER — GLYCOPYRROLATE 0.2 MG/ML IJ SOLN
INTRAMUSCULAR | Status: DC | PRN
  Administered 2011-06-09: .6 mg via INTRAVENOUS

## 2011-06-09 MED ORDER — INDIGOTINDISULFONATE SODIUM 8 MG/ML IJ SOLN
INTRAMUSCULAR | Status: DC | PRN
  Administered 2011-06-09 (×2): 5 mL via INTRAVENOUS

## 2011-06-09 MED ORDER — ONDANSETRON HCL 4 MG/2ML IJ SOLN
INTRAMUSCULAR | Status: AC
  Filled 2011-06-09: qty 2

## 2011-06-09 MED ORDER — SODIUM CHLORIDE 0.9 % IV SOLN
1.5000 g | Freq: Once | INTRAVENOUS | Status: AC
  Administered 2011-06-09: 1.5 g via INTRAVENOUS

## 2011-06-09 MED ORDER — HEMOSTATIC AGENTS (NO CHARGE) OPTIME
TOPICAL | Status: DC | PRN
  Administered 2011-06-09: 1

## 2011-06-09 MED ORDER — SODIUM CHLORIDE 0.9 % IR SOLN
Status: DC | PRN
  Administered 2011-06-09: 400 mL

## 2011-06-09 MED ORDER — ACETAMINOPHEN 10 MG/ML IV SOLN
1000.0000 mg | Freq: Four times a day (QID) | INTRAVENOUS | Status: AC
  Administered 2011-06-09 – 2011-06-10 (×4): 1000 mg via INTRAVENOUS
  Filled 2011-06-09 (×6): qty 100

## 2011-06-09 MED ORDER — BISACODYL 10 MG RE SUPP: 10.0000 mg | Freq: Every day | RECTAL | Status: DC | PRN

## 2011-06-09 MED ORDER — HYDROMORPHONE HCL PF 1 MG/ML IJ SOLN
0.2500 mg | INTRAMUSCULAR | Status: DC | PRN
  Administered 2011-06-09 (×2): 0.5 mg via INTRAVENOUS

## 2011-06-09 MED ORDER — ACETAMINOPHEN 325 MG PO TABS: 650.0000 mg | ORAL_TABLET | ORAL | Status: DC | PRN

## 2011-06-09 MED ORDER — ALPRAZOLAM 0.5 MG PO TABS
0.5000 mg | ORAL_TABLET | Freq: Every day | ORAL | Status: DC
  Administered 2011-06-09 – 2011-06-10 (×2): 0.5 mg via ORAL
  Filled 2011-06-09: qty 1

## 2011-06-09 MED ORDER — CEFAZOLIN SODIUM 1-5 GM-% IV SOLN
1.0000 g | Freq: Three times a day (TID) | INTRAVENOUS | Status: AC
  Administered 2011-06-09 (×2): 1 g via INTRAVENOUS
  Filled 2011-06-09 (×2): qty 50

## 2011-06-09 MED ORDER — PANTOPRAZOLE SODIUM 40 MG PO TBEC
40.0000 mg | DELAYED_RELEASE_TABLET | Freq: Every day | ORAL | Status: DC
  Administered 2011-06-10: 40 mg via ORAL
  Filled 2011-06-09: qty 1

## 2011-06-09 MED ORDER — FENTANYL CITRATE 0.05 MG/ML IJ SOLN
INTRAMUSCULAR | Status: DC | PRN
  Administered 2011-06-09 (×2): 50 ug via INTRAVENOUS
  Administered 2011-06-09: 100 ug via INTRAVENOUS
  Administered 2011-06-09: 50 ug via INTRAVENOUS
  Administered 2011-06-09: 100 ug via INTRAVENOUS

## 2011-06-09 MED ORDER — NEOSTIGMINE METHYLSULFATE 1 MG/ML IJ SOLN
INTRAMUSCULAR | Status: DC | PRN
  Administered 2011-06-09: 4 mg via INTRAVENOUS

## 2011-06-09 MED ORDER — BELLADONNA ALKALOIDS-OPIUM 16.2-60 MG RE SUPP: 1.0000 | Freq: Four times a day (QID) | RECTAL | Status: DC | PRN

## 2011-06-09 MED ORDER — HYDROMORPHONE HCL PF 1 MG/ML IJ SOLN
0.2500 mg | INTRAMUSCULAR | Status: DC | PRN
  Administered 2011-06-09: 0.5 mg via INTRAVENOUS

## 2011-06-09 MED ORDER — HYDROCODONE-ACETAMINOPHEN 5-325 MG PO TABS
1.0000 | ORAL_TABLET | ORAL | Status: DC | PRN
  Filled 2011-06-09: qty 2

## 2011-06-09 MED ORDER — ROCURONIUM BROMIDE 100 MG/10ML IV SOLN
INTRAVENOUS | Status: DC | PRN
  Administered 2011-06-09: 10 mg via INTRAVENOUS
  Administered 2011-06-09: 50 mg via INTRAVENOUS
  Administered 2011-06-09 (×2): 10 mg via INTRAVENOUS

## 2011-06-09 MED ORDER — BACITRACIN-NEOMYCIN-POLYMYXIN 400-5-5000 EX OINT: 1.0000 "application " | TOPICAL_OINTMENT | Freq: Three times a day (TID) | CUTANEOUS | Status: DC | PRN

## 2011-06-09 MED ORDER — ACETAMINOPHEN 10 MG/ML IV SOLN
1000.0000 mg | Freq: Four times a day (QID) | INTRAVENOUS | Status: DC
  Filled 2011-06-09 (×5): qty 100

## 2011-06-09 MED ORDER — MEPERIDINE HCL 50 MG/ML IJ SOLN: 6.2500 mg | INTRAMUSCULAR | Status: DC | PRN

## 2011-06-09 MED ORDER — ONDANSETRON HCL 4 MG/2ML IJ SOLN
4.0000 mg | INTRAMUSCULAR | Status: DC | PRN
  Administered 2011-06-09 (×2): 4 mg via INTRAVENOUS
  Filled 2011-06-09: qty 2

## 2011-06-09 MED ORDER — ZOLPIDEM TARTRATE 5 MG PO TABS: 5.0000 mg | ORAL_TABLET | Freq: Every evening | ORAL | Status: DC | PRN

## 2011-06-09 MED ORDER — OMEGA-3-ACID ETHYL ESTERS 1 G PO CAPS
2.0000 g | ORAL_CAPSULE | Freq: Two times a day (BID) | ORAL | Status: DC
  Filled 2011-06-09 (×3): qty 2

## 2011-06-09 MED ORDER — PROPOFOL 10 MG/ML IV EMUL
INTRAVENOUS | Status: DC | PRN
  Administered 2011-06-09: 200 mg via INTRAVENOUS

## 2011-06-09 MED ORDER — ONDANSETRON HCL 4 MG/2ML IJ SOLN
INTRAMUSCULAR | Status: DC | PRN
  Administered 2011-06-09: 4 mg via INTRAVENOUS

## 2011-06-09 MED ORDER — HYDROMORPHONE HCL PF 1 MG/ML IJ SOLN
INTRAMUSCULAR | Status: DC | PRN
  Administered 2011-06-09 (×2): 1 mg via INTRAVENOUS

## 2011-06-09 MED ORDER — MORPHINE SULFATE 2 MG/ML IJ SOLN
2.0000 mg | INTRAMUSCULAR | Status: DC | PRN
  Administered 2011-06-09 (×3): 4 mg via INTRAVENOUS
  Administered 2011-06-09: 2 mg via INTRAVENOUS
  Administered 2011-06-10: 4 mg via INTRAVENOUS
  Administered 2011-06-10 (×2): 2 mg via INTRAVENOUS
  Administered 2011-06-10 (×2): 4 mg via INTRAVENOUS
  Filled 2011-06-09: qty 2
  Filled 2011-06-09 (×2): qty 1
  Filled 2011-06-09: qty 2
  Filled 2011-06-09: qty 1
  Filled 2011-06-09 (×2): qty 2
  Filled 2011-06-09: qty 1
  Filled 2011-06-09: qty 2

## 2011-06-09 MED ORDER — DEXTROSE-NACL 5-0.45 % IV SOLN
INTRAVENOUS | Status: DC
  Administered 2011-06-09 – 2011-06-10 (×2): via INTRAVENOUS

## 2011-06-09 MED ORDER — ACYCLOVIR 200 MG PO CAPS
200.0000 mg | ORAL_CAPSULE | ORAL | Status: DC
  Filled 2011-06-09 (×9): qty 1

## 2011-06-09 MED ORDER — PHENOL 1.4 % MT LIQD: 1.0000 | OROMUCOSAL | Status: DC | PRN

## 2011-06-09 MED ORDER — LISINOPRIL 10 MG PO TABS
10.0000 mg | ORAL_TABLET | Freq: Every day | ORAL | Status: DC
  Administered 2011-06-09: 10 mg via ORAL
  Filled 2011-06-09 (×2): qty 1

## 2011-06-09 MED ORDER — KETOROLAC TROMETHAMINE 30 MG/ML IJ SOLN
30.0000 mg | Freq: Four times a day (QID) | INTRAMUSCULAR | Status: DC
  Administered 2011-06-09 – 2011-06-10 (×4): 30 mg via INTRAVENOUS
  Filled 2011-06-09 (×7): qty 1

## 2011-06-09 MED ORDER — LACTATED RINGERS IR SOLN
Status: DC | PRN
  Administered 2011-06-09: 200 mL

## 2011-06-09 MED ORDER — DEXAMETHASONE SODIUM PHOSPHATE 10 MG/ML IJ SOLN: 8.0000 mg | Freq: Once | INTRAMUSCULAR | Status: DC | PRN

## 2011-06-09 MED ORDER — MIDAZOLAM HCL 5 MG/5ML IJ SOLN
INTRAMUSCULAR | Status: DC | PRN
  Administered 2011-06-09: 2 mg via INTRAVENOUS

## 2011-06-09 MED ORDER — SODIUM CHLORIDE 0.9 % IV BOLUS (SEPSIS): 1000.0000 mL | Freq: Once | INTRAVENOUS | Status: DC

## 2011-06-09 MED ORDER — MORPHINE SULFATE 4 MG/ML IJ SOLN
INTRAMUSCULAR | Status: AC
  Filled 2011-06-09: qty 1

## 2011-06-09 MED ORDER — STERILE WATER FOR IRRIGATION IR SOLN
Status: DC | PRN
  Administered 2011-06-09: 1500 mL

## 2011-06-09 MED ORDER — MENTHOL 3 MG MT LOZG
1.0000 | LOZENGE | OROMUCOSAL | Status: DC | PRN
  Filled 2011-06-09: qty 9

## 2011-06-09 MED ORDER — LACTATED RINGERS IV SOLN: Freq: Once | INTRAVENOUS | Status: DC

## 2011-06-09 MED ORDER — ACETAMINOPHEN 10 MG/ML IV SOLN
INTRAVENOUS | Status: DC | PRN
  Administered 2011-06-09: 1000 mg via INTRAVENOUS

## 2011-06-09 MED ORDER — TEMAZEPAM 15 MG PO CAPS
30.0000 mg | ORAL_CAPSULE | Freq: Every day | ORAL | Status: DC
  Administered 2011-06-09: 30 mg via ORAL
  Filled 2011-06-09 (×2): qty 1

## 2011-06-09 MED ORDER — FLUTICASONE PROPIONATE 50 MCG/ACT NA SUSP
2.0000 | Freq: Every day | NASAL | Status: DC
  Filled 2011-06-09: qty 16

## 2011-06-09 MED ORDER — HYDROMORPHONE HCL PF 1 MG/ML IJ SOLN: 0.2500 mg | INTRAMUSCULAR | Status: DC | PRN

## 2011-06-09 MED ORDER — ALLOPURINOL 300 MG PO TABS
300.0000 mg | ORAL_TABLET | ORAL | Status: DC
  Administered 2011-06-10: 300 mg via ORAL
  Filled 2011-06-09 (×2): qty 1

## 2011-06-09 MED ORDER — BUPIVACAINE-EPINEPHRINE 0.25% -1:200000 IJ SOLN
INTRAMUSCULAR | Status: DC | PRN
  Administered 2011-06-09: 10 mL
  Administered 2011-06-09: 26 mL

## 2011-06-09 SURGICAL SUPPLY — 44 items
APL ESCP 34 STRL LF DISP (HEMOSTASIS) ×1
APPLICATOR SURGIFLO ENDO (HEMOSTASIS) ×1 IMPLANT
CANISTER SUCTION 2500CC (MISCELLANEOUS) ×2 IMPLANT
CATH FOLEY 2WAY SLVR  5CC 20FR (CATHETERS) ×1
CATH FOLEY 2WAY SLVR 5CC 20FR (CATHETERS) ×1 IMPLANT
CATH ROBINSON RED A/P 8FR (CATHETERS) ×2 IMPLANT
CHLORAPREP W/TINT 26ML (MISCELLANEOUS) ×2 IMPLANT
CLIP LIGATING HEM O LOK PURPLE (MISCELLANEOUS) ×4 IMPLANT
CLOTH BEACON ORANGE TIMEOUT ST (SAFETY) ×2 IMPLANT
COVER SURGICAL LIGHT HANDLE (MISCELLANEOUS) ×2 IMPLANT
COVER TIP SHEARS 8 DVNC (MISCELLANEOUS) ×1 IMPLANT
COVER TIP SHEARS 8MM DA VINCI (MISCELLANEOUS) ×1
CUTTER LINEAR FLEX ECHELON 45 (STAPLE) ×2 IMPLANT
DECANTER SPIKE VIAL GLASS SM (MISCELLANEOUS) ×1 IMPLANT
DRAPE SURG IRRIG POUCH 19X23 (DRAPES) ×2 IMPLANT
DRAPE UTILITY 15X26 (DRAPE) ×2 IMPLANT
DRSG TEGADERM 6X8 (GAUZE/BANDAGES/DRESSINGS) ×5 IMPLANT
ELECT REM PT RETURN 9FT ADLT (ELECTROSURGICAL) ×2
ELECTRODE REM PT RTRN 9FT ADLT (ELECTROSURGICAL) ×1 IMPLANT
FLOSEAL 10ML (HEMOSTASIS) ×1 IMPLANT
GLOVE BIO SURGEON STRL SZ 6.5 (GLOVE) ×4 IMPLANT
GLOVE BIOGEL M STRL SZ7.5 (GLOVE) ×2 IMPLANT
GOWN PREVENTION PLUS XLARGE (GOWN DISPOSABLE) ×2 IMPLANT
GOWN STRL NON-REIN LRG LVL3 (GOWN DISPOSABLE) ×2 IMPLANT
GOWN STRL REIN XL XLG (GOWN DISPOSABLE) ×2 IMPLANT
HOLDER FOLEY CATH W/STRAP (MISCELLANEOUS) ×2 IMPLANT
IV LACTATED RINGERS 1000ML (IV SOLUTION) ×2 IMPLANT
KIT ACCESSORY DA VINCI DISP (KITS) ×1
KIT ACCESSORY DVNC DISP (KITS) ×1 IMPLANT
NDL SAFETY ECLIPSE 18X1.5 (NEEDLE) ×1 IMPLANT
NEEDLE HYPO 18GX1.5 SHARP (NEEDLE) ×2
PACK ROBOT UROLOGY CUSTOM (CUSTOM PROCEDURE TRAY) ×2 IMPLANT
POSITIONER SURGICAL ARM (MISCELLANEOUS) ×4 IMPLANT
RELOAD GREEN ECHELON 45 (STAPLE) ×2 IMPLANT
SEALER TISSUE G2 CVD JAW 45CM (ENDOMECHANICALS) ×1 IMPLANT
SET TUBE IRRIG SUCTION NO TIP (IRRIGATION / IRRIGATOR) ×2 IMPLANT
SOLUTION ELECTROLUBE (MISCELLANEOUS) ×2 IMPLANT
SPONGE GAUZE 4X4 12PLY (GAUZE/BANDAGES/DRESSINGS) ×2 IMPLANT
SUT VIC AB 2-0 SH 27 (SUTURE) ×2
SUT VIC AB 2-0 SH 27X BRD (SUTURE) ×1 IMPLANT
SUT VICRYL 0 UR6 27IN ABS (SUTURE) ×2 IMPLANT
SYR 27GX1/2 1ML LL SAFETY (SYRINGE) ×2 IMPLANT
TOWEL OR NON WOVEN STRL DISP B (DISPOSABLE) ×2 IMPLANT
WATER STERILE IRR 1500ML POUR (IV SOLUTION) ×4 IMPLANT

## 2011-06-09 NOTE — Op Note (Signed)
Preoperative diagnosis: Clinical stage T1c Adenocarcinoma prostate Postoperative diagnosis: Same Procedure: Robotic-assisted laparoscopic radical retropubic prostatectomy with bilateral pelvic lymph node dissection  Surgeon: Valetta Fuller, MD Asst.: Pecola Leisure, NP Anesthesia: Gen. Endotracheal  Indications: Patient was diagnosed with clinical stage TIc Adenocarcinoma the prostate.he underwent extensive consultation with regard to treatment options. The patient shows a surgical approach. He appeared to understand a distinct advantages as well as the disadvantages of this procedure. The patient has performed a mechanical bowel prep. He has had placement of PAS compression boots.he is received perioperative antibiotics. The patient's preoperative PSA was 5.3. Ultrasound revealed a 15 g prostate. 6 biopsies were positive on the right side with a primary Gleason 3+4 equals 7 cancer. Left-sided biopsies were negative.  Technique and findings:The patient was brought to the operating room and had successful induction of general endotracheal anesthesia.the patient was placed in a low lithotomy position with careful padding of all extremities. He was secured to the operative table and placed in the steep Trendelenburg position. He was prepped and draped in usual manner. A Foley catheter was placed sterilely on the field. Camera port site was chosen 18 cm above the pubic symphysis just to the left of the umbilicus. A standard open Hassan technique was utilized. A 12 mm trocar was placed without difficulty. The camera was then inserted and no abnormalities were noted within the pelvis. The trochars were placed with direct visual guidance. This included 3 8mm robotic trochars and a 12 mm and 5 mm assist ports. Once all the ports were placed the robot was docked. The bladder was filled and the space of Retzius was developed with electrocautery dissection as well as blunt dissection. Superficial fat off the  endopelvic fascia and bladder neck was removed with electrocautery scissors. The endopelvic fascia was then incised bilaterally from base to apex. Levator musculature was swept off the apex of the prostate isolating the dorsal venous complex which was then stapled with the ETS stapling device. The anterior bladder neck was identified with the aid of the Foley balloon. This was then transected down to the Foley catheter with electrocautery scissors. The Foley catheter was then retracted anteriorly. Indigo carmine was given and we appeared to be well away from the ureteral orifices. The posterior bladder neck was then transected and the dissection carried down to the adnexal structures. The seminal vesicles and vas deferens on both sides were then individually dissected free and retracted anteriorly. The posterior plane between the rectum and prostate was then established primarily with blunt dissection.  Attention was then turned towards nerve sparing. The patient was felt to be a candidate for left-sided nerve sparing and limited right-sided nerve sparing. Superficial fascia along the anterior lateral aspect of the prostate was incised bilaterally. This tissue was then swept laterally until we were able to establish a groove between the neurovascular tissue and the posterior lateral aspect on the prostate bilaterally. This groove was then extended from the apex back to the base of the prostate. With the prostate retracted anteriorly the vascular pedicles of the prostate were taken with the Enseal device. The Foley catheter was then reinserted and the anterior urethra was transected. The posterior urethra was then transected as were some rectourethralis fibers. The prostate was then removed from the pelvis. The pelvis was then copiously irrigated. Rectal insufflation was performed and there was no evidence of rectal injury.  Attention was then turned towards bilateral pelvic lymph node dissection. The obturator  node packets were removed I  laterally and the dissection extended towards the bifurcation of the iliac artery. The obturator nerve was identified on both sides and preserved. Hemalock clips were used for small veins and lymphatic channels. The node packets were sent for permanent analysis.  Attention was then turned towards reconstruction. The bladder neck did not require any reconstruction. The bladder neck and posterior urethra were reapproximated at the 6:00 position utilizing a 2-0 Vicryl suture. The rest of the anastomosis was done with a double-armed 3-0 Monocryl suture in a 360 degree manner. Additional indigo carmine was given. A new catheter was placed and bladder irrigation revealed no evidence of leakage. A Blake drain was placed through one of the robotic trochars and positioned in the retropubic space above the anastomosis. This was then secured to the skin with a nylon suture. The prostate was placed in the Endopouch retrieval bag. The 12 mm trocar site was closed with a Vicryl suture with the aid of a suture passer. Our other trochars were taken out with direct visual guidance without evidence of any bleeding. The camera port incision was extended slightly to allow for removal of the specimen and then closed with a running Vicryl suture. All port sites were infiltrated with Marcaine and then closed with surgical clips. The patient was then taken to recovery room having had no obvious complications or problems. Sponge and needle counts were correct.

## 2011-06-09 NOTE — Interval H&P Note (Signed)
History and Physical Interval Note:   06/09/2011   8:18 AM   Bobby Gamble  has presented today for surgery, with the diagnosis of Prostate cancer  The various methods of treatment have been discussed with the patient and family. After consideration of risks, benefits and other options for treatment, the patient has consented to  Procedure(s): ROBOTIC ASSISTED LAPAROSCOPIC RADICAL PROSTATECTOMY as a surgical intervention .  The patients' history has been reviewed, patient examined, no change in status, stable for surgery.  I have reviewed the patients' chart and labs.  Questions were answered to the patient's satisfaction.     Valetta Fuller  MD

## 2011-06-09 NOTE — Anesthesia Preprocedure Evaluation (Addendum)
Anesthesia Evaluation  Patient identified by MRN, date of birth, ID band Patient awake    Reviewed: Allergy & Precautions, H&P , NPO status , Patient's Chart, lab work & pertinent test results  Airway Mallampati: II TM Distance: >3 FB Neck ROM: Full    Dental No notable dental hx.    Pulmonary neg pulmonary ROS,  clear to auscultation  Pulmonary exam normal       Cardiovascular hypertension, Pt. on medications neg cardio ROS Regular Normal    Neuro/Psych Negative Neurological ROS  Negative Psych ROS   GI/Hepatic negative GI ROS, Neg liver ROS, GERD-  Medicated and Controlled,  Endo/Other  Negative Endocrine ROS  Renal/GU negative Renal ROS  Genitourinary negative   Musculoskeletal negative musculoskeletal ROS (+)   Abdominal   Peds  Hematology negative hematology ROS (+)   Anesthesia Other Findings   Reproductive/Obstetrics negative OB ROS                          Anesthesia Physical Anesthesia Plan  ASA: II  Anesthesia Plan: General   Post-op Pain Management:    Induction: Intravenous  Airway Management Planned: Oral ETT  Additional Equipment:   Intra-op Plan:   Post-operative Plan: Extubation in OR  Informed Consent: I have reviewed the patients History and Physical, chart, labs and discussed the procedure including the risks, benefits and alternatives for the proposed anesthesia with the patient or authorized representative who has indicated his/her understanding and acceptance.   Dental advisory given  Plan Discussed with: CRNA  Anesthesia Plan Comments:         Anesthesia Quick Evaluation

## 2011-06-09 NOTE — Transfer of Care (Signed)
Immediate Anesthesia Transfer of Care Note  Patient: Bobby Gamble  Procedure(s) Performed:  ROBOTIC ASSISTED LAPAROSCOPIC RADICAL PROSTATECTOMY - with Bilateral Lymph Node Dessection  Patient Location: PACU  Anesthesia Type: General  Level of Consciousness: awake and alert   Airway & Oxygen Therapy: Patient Spontanous Breathing and Patient connected to face mask oxygen  Post-op Assessment: Report given to PACU RN  Post vital signs: Reviewed  Complications: No apparent anesthesia complications

## 2011-06-09 NOTE — Anesthesia Postprocedure Evaluation (Signed)
  Anesthesia Post-op Note  Patient: Bobby Gamble  Procedure(s) Performed:  ROBOTIC ASSISTED LAPAROSCOPIC RADICAL PROSTATECTOMY - with Bilateral Lymph Node Dessection  Patient Location: PACU  Anesthesia Type: General  Level of Consciousness: awake and alert   Airway and Oxygen Therapy: Patient Spontanous Breathing  Post-op Pain: mild  Post-op Assessment: Post-op Vital signs reviewed, Patient's Cardiovascular Status Stable, Respiratory Function Stable, Patent Airway and No signs of Nausea or vomiting  Post-op Vital Signs: stable  Complications: No apparent anesthesia complications

## 2011-06-09 NOTE — Progress Notes (Signed)
  Post-op note  Subjective: The patient is doing well. He is complaining of nausea without vomiting.  He is also c/o abd soreness.  Objective: Vital signs in last 24 hours: Temp:  [97.5 F (36.4 C)-98.6 F (37 C)] 97.5 F (36.4 C) (11/14 1330) Pulse Rate:  [64-89] 81  (11/14 1330) Resp:  [9-21] 14  (11/14 1330) BP: (118-137)/(70-91) 137/84 mmHg (11/14 1330) SpO2:  [98 %-100 %] 98 % (11/14 1330) Weight:  [77.111 kg (170 lb)] 170 lb (77.111 kg) (11/14 1500)    Intake/Output this shift: Total I/O In: 2420 [P.O.:120; I.V.:1300; IV Piggyback:1000] Out: 310 [Urine:80; Drains:80; Blood:150]  Physical Exam:  General: Alert and oriented. Abdomen: Soft, Nondistended. Incisions: Clean and dry. Drain in place  Lab Results:  Basename 06/09/11 1230  HGB 12.8*  HCT 35.3*    Assessment/Plan: POD#0   1) Continue to monitor Zofran now.  Pt just received morphine 5 minutes ago for pain.  IV tylenol is scheduled.   Bobby Gamble, Bobby Gamble. MD   LOS: 0 days   Bobby Gamble 06/09/2011, 4:13 PM

## 2011-06-09 NOTE — Anesthesia Procedure Notes (Signed)
Procedure Name: Intubation Date/Time: 06/09/2011 8:43 AM Performed by: [place], Amandeep Hogston C Pre-anesthesia Checklist: Patient identified, Emergency Drugs available, Suction available and Patient being monitored Patient Re-evaluated:Patient Re-evaluated prior to inductionOxygen Delivery Method: Circle System Utilized Preoxygenation: Pre-oxygenation with 100% oxygen Intubation Type: Combination inhalational/ intravenous induction Ventilation: Mask ventilation without difficulty Laryngoscope Size: Mac and 3 Grade View: Grade I Tube type: Oral Number of attempts: 1 Placement Confirmation: ETT inserted through vocal cords under direct vision,  positive ETCO2,  CO2 detector and breath sounds checked- equal and bilateral Secured at: 22 cm Tube secured with: Tape Dental Injury: Teeth and Oropharynx as per pre-operative assessment

## 2011-06-10 DIAGNOSIS — C61 Malignant neoplasm of prostate: Secondary | ICD-10-CM | POA: Diagnosis present

## 2011-06-10 LAB — BASIC METABOLIC PANEL
BUN: 10 mg/dL (ref 6–23)
CO2: 27 mEq/L (ref 19–32)
Calcium: 8.3 mg/dL — ABNORMAL LOW (ref 8.4–10.5)
Chloride: 108 mEq/L (ref 96–112)
Creatinine, Ser: 1.25 mg/dL (ref 0.50–1.35)
GFR calc Af Amer: 74 mL/min — ABNORMAL LOW (ref >90)
GFR calc non Af Amer: 64 mL/min — ABNORMAL LOW (ref >90)
Glucose, Bld: 123 mg/dL — ABNORMAL HIGH (ref 70–99)
Potassium: 3.6 mEq/L (ref 3.5–5.1)
Sodium: 140 mEq/L (ref 135–145)

## 2011-06-10 LAB — HEMOGLOBIN AND HEMATOCRIT, BLOOD
HCT: 33.4 % — ABNORMAL LOW (ref 39.0–52.0)
Hemoglobin: 11.5 g/dL — ABNORMAL LOW (ref 13.0–17.0)

## 2011-06-10 MED ORDER — HYDROMORPHONE HCL 2 MG PO TABS: 2.0000 mg | ORAL_TABLET | ORAL | Status: AC | PRN

## 2011-06-10 MED ORDER — BISACODYL 10 MG RE SUPP
10.0000 mg | Freq: Once | RECTAL | Status: AC
  Administered 2011-06-10: 10 mg via RECTAL
  Filled 2011-06-10: qty 1

## 2011-06-10 MED ORDER — LIDOCAINE HCL 2 % EX GEL: CUTANEOUS | Status: AC | PRN

## 2011-06-10 MED ORDER — CIPROFLOXACIN HCL 250 MG PO TABS: 250.0000 mg | ORAL_TABLET | Freq: Two times a day (BID) | ORAL | Status: AC

## 2011-06-10 MED ORDER — ONDANSETRON HCL 4 MG PO TABS: 4.0000 mg | ORAL_TABLET | Freq: Three times a day (TID) | ORAL | Status: AC | PRN

## 2011-06-10 NOTE — Plan of Care (Signed)
Problem: Discharge Progression Outcomes Goal: Other Discharge Outcomes/Goals Outcome: Completed/Met Date Met:  06/10/11 Pt and wife showed return demonstration of leg bag to night bag vice versa.  Pt and wife felt comfortable and confident.

## 2011-06-10 NOTE — Progress Notes (Signed)
1 Day Post-Op Subjective: Patient reports: Pt c/o pain this am.  He states he "got behind" on his pain meds last night.  He denies N/V.  He is hungry.  No flatus.  Tol liquids last night.  Amb x 2 yesterday.  Objective: Vital signs in last 24 hours: Temp:  [97.5 F (36.4 C)-98.6 F (37 C)] 98.4 F (36.9 C) (11/15 0646) Pulse Rate:  [64-92] 83  (11/15 0646) Resp:  [9-21] 18  (11/15 0646) BP: (109-137)/(67-91) 128/82 mmHg (11/15 0646) SpO2:  [98 %-100 %] 100 % (11/15 0646) Weight:  [77.111 kg (170 lb)] 170 lb (77.111 kg) (11/14 1500)  Intake/Output from previous day: 11/14 0701 - 11/15 0700 In: 2920 [P.O.:120; I.V.:1800; IV Piggyback:1000] Out: 2850 [Urine:2480; Drains:190; Stool:30; Blood:150]  Physical Exam:  General: NAD Cardiovascular: RRR Lungs: CTA GI: soft ND +BS Incisions: C/D/I  Lab Results:  Basename 06/10/11 0447 06/09/11 1230  HGB 11.5* 12.8*  HCT 33.4* 35.3*   BMET  Basename 06/10/11 0447  NA 140  K 3.6  CL 108  CO2 27  GLUCOSE 123*  BUN 10  CREATININE 1.25  CALCIUM 8.3*     Assessment/Plan: 1 Day Post-Op, Procedure(s) (LRB): ROBOTIC ASSISTED LAPAROSCOPIC RADICAL PROSTATECTOMY (Bilateral)  Ambulate, Incentive spirometry Transition to PO pain medications SL IVF Pt states vicodin makes him very nauseous.  Will discuss oral pain med options with Dr. Isabel Caprice. Dulcolax suppository Likely D/C drain Poss d/c home later today Adv diet with flatus/BM   LOS: 1 day   YARBROUGH,Bobby Aubry G. 06/10/2011, 7:19 AM

## 2011-06-10 NOTE — Discharge Summary (Signed)
Date of admission: 06/09/2011  Date of discharge: 06/10/2011  Admission diagnosis: Prostate Cancer  Discharge diagnosis: Prostate Cancer  History and Physical: For full details, please see admission history and physical. Briefly, Bobby Gamble is a 55 y.o. gentleman with localized prostate cancer.  After discussing management/treatment options, he elected to proceed with surgical treatment.  Hospital Course: Bobby Gamble was taken to the operating room on 06/09/2011 and underwent a robotic assisted laparoscopic radical prostatectomy. He tolerated this procedure well and without complications. Postoperatively, he was able to be transferred to a regular hospital room following recovery from anesthesia.  He was able to begin ambulating the night of surgery. He remained hemodynamically stable overnight. He had some post op nausea which resolved with prn zofran.  He had excellent urine output with appropriately minimal output from his pelvic drain and his pelvic drain was removed on POD #1.  He was transitioned to oral pain medication, tolerated a clear liquid diet, and had met all discharge criteria and was able to be discharged home later on POD#1.  Laboratory values:  Basename 06/10/11 0447 06/09/11 1230  HGB 11.5* 12.8*  HCT 33.4* 35.3*    Disposition: Home  Discharge instruction: He was instructed to be ambulatory but to refrain from heavy lifting, strenuous activity, or driving. He was instructed on urethral catheter care.  Discharge medications:   Medication List  As of 06/10/2011  2:44 PM   START taking these medications         ciprofloxacin 250 MG tablet   Commonly known as: CIPRO   Take 1 tablet (250 mg total) by mouth 2 (two) times daily.   Start taking on: 06/14/2011      HYDROmorphone 2 MG tablet   Commonly known as: DILAUDID   Take 1 tablet (2 mg total) by mouth every 4 (four) hours as needed for pain.      lidocaine 2 % jelly   Commonly known as: XYLOCAINE   Apply topically as needed.      ondansetron 4 MG tablet   Commonly known as: ZOFRAN   Take 1 tablet (4 mg total) by mouth every 8 (eight) hours as needed for nausea.         CONTINUE taking these medications         acyclovir 200 MG capsule   Commonly known as: ZOVIRAX      allopurinol 300 MG tablet   Commonly known as: ZYLOPRIM      ALPRAZolam 0.5 MG tablet   Commonly known as: XANAX      fluticasone 50 MCG/ACT nasal spray   Commonly known as: FLONASE      LIPOFEN 150 MG Caps   Generic drug: Fenofibrate      lisinopril 10 MG tablet   Commonly known as: PRINIVIL,ZESTRIL      multivitamins ther. w/minerals Tabs      OMEGA 3 1200 MG Caps      omega-3 acid ethyl esters 1 G capsule   Commonly known as: LOVAZA      omeprazole 20 MG capsule   Commonly known as: PRILOSEC      temazepam 30 MG capsule   Commonly known as: RESTORIL      Vitamin D3 2000 UNITS Tabs          Where to get your medications    These are the prescriptions that you need to pick up.   You may get these medications from any pharmacy.  ciprofloxacin 250 MG tablet   HYDROmorphone 2 MG tablet   lidocaine 2 % jelly   ondansetron 4 MG tablet            Followup: He will followup in 1 week for catheter and staple removal and to discuss his surgical pathology results.

## 2011-06-11 ENCOUNTER — Encounter (HOSPITAL_COMMUNITY): Payer: Self-pay | Admitting: Urology

## 2014-12-27 ENCOUNTER — Encounter: Payer: Self-pay | Admitting: Gastroenterology

## 2018-07-26 HISTORY — PX: COLONOSCOPY: SHX174

## 2019-02-19 ENCOUNTER — Other Ambulatory Visit: Payer: Self-pay

## 2019-02-19 DIAGNOSIS — Z20822 Contact with and (suspected) exposure to covid-19: Secondary | ICD-10-CM

## 2019-02-20 LAB — NOVEL CORONAVIRUS, NAA: SARS-CoV-2, NAA: NOT DETECTED

## 2019-03-08 ENCOUNTER — Ambulatory Visit
Admission: RE | Admit: 2019-03-08 | Discharge: 2019-03-08 | Disposition: A | Discharge status: Home / Self Care | Payer: No Typology Code available for payment source | Source: Ambulatory Visit | Attending: Internal Medicine | Admitting: Internal Medicine

## 2019-03-08 ENCOUNTER — Other Ambulatory Visit: Payer: Self-pay

## 2019-03-08 ENCOUNTER — Other Ambulatory Visit: Payer: Self-pay | Admitting: Internal Medicine

## 2019-03-08 DIAGNOSIS — R059 Cough, unspecified: Secondary | ICD-10-CM

## 2019-03-08 DIAGNOSIS — R05 Cough: Secondary | ICD-10-CM

## 2019-05-16 ENCOUNTER — Encounter: Payer: Self-pay | Admitting: Gastroenterology

## 2020-06-03 ENCOUNTER — Telehealth: Payer: Self-pay | Admitting: Hematology and Oncology

## 2020-06-03 NOTE — Telephone Encounter (Signed)
Received a new pt referral from Dr. Ashby Dawes for thrombocytopenia. Mr. Bobby Gamble has been scheduled to see Dr. Lorenso Courier on 12/3 at 1pm. Appt date and time has been given to the pt's daughter. Aware to arrive 30 minutes early.

## 2020-06-24 ENCOUNTER — Telehealth: Payer: Self-pay | Admitting: Hematology and Oncology

## 2020-06-24 NOTE — Telephone Encounter (Signed)
Bobby Gamble was scheduled in error to see Dr. Alvy Bimler on 12/1 at 1pm. He was suppose to see Dr. Lorenso Courier on 12/3 at 1pm. Appt was unavailable and therefore rescheduled to see Dr. Lorenso Courier on 12/6 at 2pm. Appt date and time was given to the pt's wife.

## 2020-06-25 ENCOUNTER — Encounter: Payer: Self-pay | Admitting: Hematology and Oncology

## 2020-06-27 ENCOUNTER — Other Ambulatory Visit: Payer: Self-pay

## 2020-06-30 ENCOUNTER — Other Ambulatory Visit: Payer: Self-pay

## 2020-06-30 ENCOUNTER — Inpatient Hospital Stay (HOSPITAL_BASED_OUTPATIENT_CLINIC_OR_DEPARTMENT_OTHER): Payer: Self-pay | Admitting: Hematology and Oncology

## 2020-06-30 ENCOUNTER — Encounter: Payer: Self-pay | Admitting: Hematology and Oncology

## 2020-06-30 ENCOUNTER — Inpatient Hospital Stay: Payer: Self-pay | Attending: Hematology and Oncology

## 2020-06-30 VITALS — BP 123/79 | HR 73 | Temp 99.2°F | Resp 16 | Ht 66.0 in | Wt 173.1 lb

## 2020-06-30 DIAGNOSIS — Z7289 Other problems related to lifestyle: Secondary | ICD-10-CM | POA: Insufficient documentation

## 2020-06-30 DIAGNOSIS — D696 Thrombocytopenia, unspecified: Secondary | ICD-10-CM | POA: Insufficient documentation

## 2020-06-30 DIAGNOSIS — Z87891 Personal history of nicotine dependence: Secondary | ICD-10-CM

## 2020-06-30 DIAGNOSIS — K219 Gastro-esophageal reflux disease without esophagitis: Secondary | ICD-10-CM | POA: Insufficient documentation

## 2020-06-30 DIAGNOSIS — Z8546 Personal history of malignant neoplasm of prostate: Secondary | ICD-10-CM

## 2020-06-30 DIAGNOSIS — I10 Essential (primary) hypertension: Secondary | ICD-10-CM | POA: Insufficient documentation

## 2020-06-30 DIAGNOSIS — F419 Anxiety disorder, unspecified: Secondary | ICD-10-CM | POA: Insufficient documentation

## 2020-06-30 DIAGNOSIS — C61 Malignant neoplasm of prostate: Secondary | ICD-10-CM

## 2020-06-30 LAB — CMP (CANCER CENTER ONLY)
ALT: 40 U/L (ref 0–44)
AST: 34 U/L (ref 15–41)
Albumin: 3.9 g/dL (ref 3.5–5.0)
Alkaline Phosphatase: 125 U/L (ref 38–126)
Anion gap: 7 (ref 5–15)
BUN: 20 mg/dL (ref 8–23)
CO2: 28 mmol/L (ref 22–32)
Calcium: 9.6 mg/dL (ref 8.9–10.3)
Chloride: 107 mmol/L (ref 98–111)
Creatinine: 1.14 mg/dL (ref 0.61–1.24)
GFR, Estimated: >60 mL/min (ref >60)
Glucose, Bld: 112 mg/dL — ABNORMAL HIGH (ref 70–99)
Potassium: 4.3 mmol/L (ref 3.5–5.1)
Sodium: 142 mmol/L (ref 135–145)
Total Bilirubin: 0.9 mg/dL (ref 0.3–1.2)
Total Protein: 7.3 g/dL (ref 6.5–8.1)

## 2020-06-30 LAB — HEPATITIS C ANTIBODY: HCV Ab: NONREACTIVE

## 2020-06-30 LAB — CBC WITH DIFFERENTIAL (CANCER CENTER ONLY)
Abs Immature Granulocytes: 0.02 10*3/uL (ref 0.00–0.07)
Basophils Absolute: 0.1 10*3/uL (ref 0.0–0.1)
Basophils Relative: 1 %
Eosinophils Absolute: 0.1 10*3/uL (ref 0.0–0.5)
Eosinophils Relative: 3 %
HCT: 39 % (ref 39.0–52.0)
Hemoglobin: 14 g/dL (ref 13.0–17.0)
Immature Granulocytes: 0 %
Lymphocytes Relative: 34 %
Lymphs Abs: 1.8 10*3/uL (ref 0.7–4.0)
MCH: 34.7 pg — ABNORMAL HIGH (ref 26.0–34.0)
MCHC: 35.9 g/dL (ref 30.0–36.0)
MCV: 96.5 fL (ref 80.0–100.0)
Monocytes Absolute: 0.5 10*3/uL (ref 0.1–1.0)
Monocytes Relative: 9 %
Neutro Abs: 2.8 10*3/uL (ref 1.7–7.7)
Neutrophils Relative %: 53 %
Platelet Count: 86 10*3/uL — ABNORMAL LOW (ref 150–400)
RBC: 4.04 MIL/uL — ABNORMAL LOW (ref 4.22–5.81)
RDW: 12.3 % (ref 11.5–15.5)
WBC Count: 5.3 10*3/uL (ref 4.0–10.5)
nRBC: 0 % (ref 0.0–0.2)

## 2020-06-30 LAB — HEPATITIS B SURFACE ANTIGEN: Hepatitis B Surface Ag: NONREACTIVE

## 2020-06-30 LAB — SAVE SMEAR(SSMR), FOR PROVIDER SLIDE REVIEW

## 2020-06-30 LAB — PLATELET BY CITRATE

## 2020-06-30 LAB — FOLATE: Folate: 11.5 ng/mL (ref >5.9)

## 2020-06-30 LAB — VITAMIN B12: Vitamin B-12: 306 pg/mL (ref 180–914)

## 2020-06-30 LAB — HEPATITIS B SURFACE ANTIBODY,QUALITATIVE: Hep B S Ab: NONREACTIVE

## 2020-06-30 LAB — IMMATURE PLATELET FRACTION: Immature Platelet Fraction: 4.3 % (ref 1.2–8.6)

## 2020-06-30 NOTE — Progress Notes (Signed)
[place] [phone]   Fax:([phone]) [place] NOTE  Patient Care Team: Merrilee Seashore, MD as PCP - General (Internal Medicine)  Hematological/Oncological History # Thrombocytopenia 06/01/2011: WBC 7.9, Hgb 14.5, MCV 93, Plt 191 05/22/2020: WBC 4.5, Hgb 14.7, MCV 97.4, Plt 80 06/30/2020: establish care with Dr. Lorenso Courier   CHIEF COMPLAINTS/PURPOSE OF CONSULTATION:  "Thrombocytopenia "  HISTORY OF PRESENTING ILLNESS:  Bobby Gamble 64 y.o. male with medical history significant for anxiety, arthritis, GERD, HTN, and HLD who presents for evaluation of thrombocytopenia.   On review of the previous records Bobby Gamble noted to have a normal CBC on 06/01/2011.  At that time he had a white blood cell count of 7.9, hemoglobin 14.5, MCV of 93, and a platelet count of 191.  More recently on 05/22/2020 the patient was found have white blood cell count of 4.5, hemoglobin 14.7, MCV of 97.4, and a platelet count of 80.  Due to concerns for this low platelet count the patient was referred to hematology for further evaluation and management.  On exam today Bobby Gamble accompanied by his wife.  He reports he is here today because "his blood platelet level is low".  He notes that approximately 9 months ago he had platelets of 96.  3 months after that he had platelets of 104 and on his most recent visit in October he was found to have platelets of 80.  He notes that this is the first that he has heard of low platelet count.  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  He denies any dark stools, bruising, or nosebleeds.  On further discussion he notes that his mother has heart disease, and his father passed away of liver failure.  He also reports that his half brother died after requiring multiple liver transplant.  He currently has 3 children who are healthy.   He was previously a smoker but quit approximately 30 to 40 years ago and smoked from age 7 until 21 there more than a pack a day.  He does report having issues with fatigue and achiness but otherwise denies any fevers, chills, sweats, nausea, vomiting or diarrhea.  A full 10 point ROS is listed below.  MEDICAL HISTORY:  Past Medical History:  Diagnosis Date  . Anxiety 06-01-11   tx. Xanax  . Arthritis 06-01-11   hx. Gout-tx. Allopurinol  . Cancer 06-01-11   Bx.  with dx. 5 weeks ago-Prostate   . GERD (gastroesophageal reflux disease) 06-01-11   tx. Prilosec  . Hyperlipidemia 06-01-11  . Hypertension   . Sinus disorder 06-01-11   allergies chronic/ sinus issues    SURGICAL HISTORY: Past Surgical History:  Procedure Laterality Date  . MANDIBLE FRACTURE SURGERY  06-01-11   Rt. lower jaw-retained hardware.  . ROBOT ASSISTED LAPAROSCOPIC RADICAL PROSTATECTOMY  06/09/2011   Procedure: ROBOTIC ASSISTED LAPAROSCOPIC RADICAL PROSTATECTOMY;  Surgeon: Bernestine Amass, MD;  Location: [facility];  Service: Urology;  Laterality: Bilateral;  with Bilateral Lymph Node Dessection  . WISDOM TOOTH EXTRACTION  06-01-11   all    SOCIAL HISTORY: Social History   Socioeconomic History  . Marital status: Married    Spouse name: Not on file  . Number of children: Not on file  . Years of education: Not on file  . Highest education level: Not on file  Occupational History  . Not on file  Tobacco Use  . Smoking status: Never Smoker  Substance and Sexual Activity  . Alcohol use: Yes    Alcohol/week: 6.0 standard drinks    Types: 6 Shots of liquor per week    Comment: 1/2 gallon per week  . Drug use: No  . Sexual activity: Yes  Other Topics Concern  . Not on file  Social History Narrative  . Not on file   Social Determinants of Health   Financial Resource Strain:   . Difficulty of Paying Living Expenses: Not on file  Food Insecurity:   . Worried About Charity fundraiser in the Last Year: Not on  file  . Ran Out of Food in the Last Year: Not on file  Transportation Needs:   . Lack of Transportation (Medical): Not on file  . Lack of Transportation (Non-Medical): Not on file  Physical Activity:   . Days of Exercise per Week: Not on file  . Minutes of Exercise per Session: Not on file  Stress:   . Feeling of Stress : Not on file  Social Connections:   . Frequency of Communication with Friends and Family: Not on file  . Frequency of Social Gatherings with Friends and Family: Not on file  . Attends Religious Services: Not on file  . Active Member of Clubs or Organizations: Not on file  . Attends Archivist Meetings: Not on file  . Marital Status: Not on file  Intimate Partner Violence:   . Fear of Current or Ex-Partner: Not on file  . Emotionally Abused: Not on file  . Physically Abused: Not on file  . Sexually Abused: Not on file    FAMILY HISTORY: No family history on file.  ALLERGIES:  is allergic to codeine.  MEDICATIONS:  Current Outpatient Medications  Medication Sig Dispense Refill  . esomeprazole (NEXIUM) 20 MG capsule Take 20 mg by mouth daily at 12 noon.    . hydrOXYzine (ATARAX/VISTARIL) 25 MG tablet TAKE 1 TO 3 TABS AT BEDTIME AS NEEDED FOR SLEEP    . acyclovir (ZOVIRAX) 200 MG capsule Take 200 mg by mouth every 4 (four) hours while awake.      . allopurinol (ZYLOPRIM) 300 MG tablet Take 300 mg by mouth every morning.  (Patient not taking: Reported on 06/30/2020)    . ALPRAZolam (XANAX) 0.5 MG tablet Take 0.5 mg by mouth daily.     . Cholecalciferol (VITAMIN D3) 2000 UNITS TABS Take 1 capsule by mouth every morning.     . fluticasone (FLONASE) 50 MCG/ACT nasal spray Place 2 sprays into the nose daily.     Marland Kitchen lisinopril (PRINIVIL,ZESTRIL) 10 MG tablet Take 10 mg by mouth at bedtime.     Marland Kitchen losartan (COZAAR) 100 MG tablet Take 100 mg by mouth daily.    . OMEGA 3 1200 MG CAPS Take 1 capsule by mouth every morning.      . temazepam (RESTORIL) 30 MG capsule  Take 30 mg by mouth at bedtime.      No current facility-administered medications for this visit.    REVIEW OF SYSTEMS:   Constitutional: ( - ) fevers, ( - )  chills , ( - ) night sweats Eyes: ( - ) blurriness of vision, ( - ) double vision, ( - ) watery eyes Ears, nose, mouth, throat, and face: ( - ) mucositis, ( - ) sore throat Respiratory: ( - ) cough, ( - ) dyspnea, ( - ) wheezes Cardiovascular: ( - ) palpitation, ( - )  chest discomfort, ( - ) lower extremity swelling Gastrointestinal:  ( - ) nausea, ( - ) heartburn, ( - ) change in bowel habits Skin: ( - ) abnormal skin rashes Lymphatics: ( - ) new lymphadenopathy, ( - ) easy bruising Neurological: ( - ) numbness, ( - ) tingling, ( - ) new weaknesses Behavioral/Psych: ( - ) mood change, ( - ) new changes  All other systems were reviewed with the patient and are negative.  PHYSICAL EXAMINATION:  Vitals:   06/30/20 1417  BP: 123/79  Pulse: 73  Resp: 16  Temp: 99.2 F (37.3 C)  SpO2: 98%   Filed Weights   06/30/20 1417  Weight: 173 lb 1.6 oz (78.5 kg)    GENERAL: well appearing middle aged Caucasian male in NAD  SKIN: skin color, texture, turgor are normal, no rashes or significant lesions EYES: conjunctiva are pink and non-injected, sclera clear LUNGS: clear to auscultation and percussion with normal breathing effort HEART: regular rate & rhythm and no murmurs and no lower extremity edema ABDOMEN: soft, non-tender, non-distended, normal bowel sounds. No HSM appreciate.  Musculoskeletal: no cyanosis of digits and no clubbing  PSYCH: alert & oriented x 3, fluent speech NEURO: no focal motor/sensory deficits  LABORATORY DATA:  I have reviewed the data as listed CBC Latest Ref Rng & Units 06/10/2011 06/09/2011 06/01/2011  WBC 4.0 - 10.5 K/uL - - 7.9  Hemoglobin 13.0 - 17.0 g/dL 11.5(L) 12.8(L) 14.5  Hematocrit 39 - 52 % 33.4(L) 35.3(L) 39.9  Platelets 150 - 400 K/uL - - 191    CMP Latest Ref Rng & Units 06/10/2011  06/01/2011 04/14/2011  Glucose 70 - 99 mg/dL 123(H) 101(H) 116(H)  BUN 6 - 23 mg/dL 10 22 -  Creatinine 0.50 - 1.35 mg/dL 1.25 1.30 -  Sodium 135 - 145 mEq/L 140 138 141  Potassium 3.5 - 5.1 mEq/L 3.6 4.1 4.3  Chloride 96 - 112 mEq/L 108 101 -  CO2 19 - 32 mEq/L 27 27 -  Calcium 8.4 - 10.5 mg/dL 8.3(L) 10.4 -  Total Protein - - - -  Total Bilirubin - - - -  Alkaline Phos - - - -  AST - - - -  ALT - - - -    RADIOGRAPHIC STUDIES: No results found.  ASSESSMENT & PLAN Bobby Gamble 64 y.o. male with medical history significant for anxiety, arthritis, GERD, HTN, and HLD who presents for evaluation of thrombocytopenia. After review the labs, the records, schedule the patient the findings are most consistent with chronic thrombocytopenia.  The differential for this includes pseudothrombocytopenia, liver disease, ITP, or bone marrow dysfunction/nutritional deficiency.  At this time I do believe the most likely etiology of this patient's thrombocytopenia is liver disease given his heavy alcohol use.  We will effectively try to rule out other diagnoses including the pseudothrombocytopenia, ITP, and bone marrow dysfunction or nutritional deficiency.  In the event that the targeted work-up is negative I recommend we proceed with an ultrasound of the liver and spleen and refer the patient to hepatology.  The patient voices understanding of the plan moving forward.  #Thrombocytopenia --differential includes pseudothrombocytopenia, liver disease, ITP, or bone marrow dysfunction/nutritional deficiency. -will order liver labs to include Hep B, C, and CMP --assess for platelet clumping with citrate, immature plt fraction, and peripheral smear --if no clear etiology noted above recommend proceeding with US liver/spleen. --RTC in 3 months or sooner if further workup is required.  #Prostate Cancer s/p Resection 2012 --noted  to be Gleason 3+4 invasive adenocarcinoma --PSA's followed by PCP --if  abnormal PSA or recurrence noted we would be able to follow the patient for this condition in our clinic.   No orders of the defined types were placed in this encounter.   All questions were answered. The patient knows to call the clinic with any problems, questions or concerns.  A total of more than 45 minutes were spent on this encounter and over half of that time was spent on counseling and coordination of care as outlined above.   Ledell Peoples, MD Department of Hematology/Oncology [place] at [facility] Phone: [phone] Pager: [phone] Email: [email or web address]  06/30/2020 2:32 PM

## 2020-07-02 ENCOUNTER — Telehealth: Payer: Self-pay | Admitting: Hematology and Oncology

## 2020-07-02 NOTE — Telephone Encounter (Signed)
Scheduled per los. Called and left msg. Mailed printout  °

## 2020-07-11 ENCOUNTER — Telehealth: Payer: Self-pay | Admitting: *Deleted

## 2020-07-11 NOTE — Telephone Encounter (Signed)
Received call from pt's wife. She states she received  A call from scheduling for an appt 3 months from now for follow up.  She seemed to think it was too far out and also wondering about lab results and any further tests etc that may need to be done.  Dr. Lorenso Courier made aware of the above

## 2020-07-23 ENCOUNTER — Telehealth: Payer: Self-pay | Admitting: *Deleted

## 2020-07-23 NOTE — Telephone Encounter (Signed)
Received TC from patient and his wife. Their concern remains 'why are pt's platelets low'.  Recent labs did not reveal any abnormalities other than platelet count of 86K. No hepatitis, LFT's within normal range.  Pt and his wife are asking what next steps are. They don't understand why they would wait 3 months for lab re-check. Advised that Dr. Leonides Schanz is out of the office but will return on 07/28/20. Advised that I would discuss with him and call them back next week.  They voiced understanding.

## 2020-07-31 ENCOUNTER — Telehealth: Payer: Self-pay | Admitting: *Deleted

## 2020-07-31 NOTE — Telephone Encounter (Signed)
-----   Message from Jaci Standard, MD sent at 07/31/2020  8:58 AM EST ----- Please let Bobby Gamble know that Bobby Gamble low platelet count is likely due to Bobby Gamble liver disease. Fortunately Bobby Gamble platelets are not dangerously low and can be monitored at this time. There are no treatments required at Bobby Gamble current platelet level.   ----- Message ----- From: Interface, Lab In [place] Sent: 06/30/2020   4:05 PM EST To: Jaci Standard, MD

## 2020-07-31 NOTE — Telephone Encounter (Signed)
TCT patient regarding  Recent lab results. No answer but was able to leave vm message for him to return this call at his earliest convenience @ [phone]

## 2020-09-30 ENCOUNTER — Other Ambulatory Visit: Payer: Self-pay | Admitting: Hematology and Oncology

## 2020-09-30 DIAGNOSIS — D696 Thrombocytopenia, unspecified: Secondary | ICD-10-CM

## 2020-09-30 NOTE — Progress Notes (Signed)
[name]:([phone]   Fax:(336) [phone]  PROGRESS NOTE  Patient Care Team: Merrilee Seashore, MD as PCP - General (Internal Medicine)  Hematological/Oncological History # Thrombocytopenia, Unclear Etiology 06/01/2011: WBC 7.9, Hgb 14.5, MCV 93, Plt 191 05/22/2020: WBC 4.5, Hgb 14.7, MCV 97.4, Plt 80 06/30/2020: establish care with Dr. Lorenso Courier. Plt 86 10/01/2020: Plt 86  Interval History:  Bobby Gamble 65 y.o. male with medical history significant for thrombocytopenia who presents for a follow up visit. The patient's last visit was on 06/30/2020. In the interim since the last visit his platelets have been stable.   On exam today Bobby Gamble ports that he has been well in the interim since her last visit.  He is a no issues with bleeding, bruising, or dark stools.  He also reports he is cutting down his alcohol consumption.  He would typically drink 3 drinks a night, however now he is down to 2 drinks a night with a goal of reaching down to 1 drink per night.  He has not been having any fevers, chills, sweats, nausea, vomiting or diarrhea.  He denies any lower extremity swelling or any other new symptoms in the interim since her last visit.  A full 10 point ROS is listed below.  Of note there is concerned that his allopurinol will be causing thrombocytopenia.  Per his PCP he is discontinued his medication, though I have low suspicion this is the cause of his findings.   MEDICAL HISTORY:  Past Medical History:  Diagnosis Date  . Anxiety 06-01-11   tx. Xanax  . Arthritis 06-01-11   hx. Gout-tx. Allopurinol  . Cancer ([place]) 06-01-11   Bx.  with dx. 5 weeks ago-Prostate   . GERD (gastroesophageal reflux disease) 06-01-11   tx. Prilosec  . Hyperlipidemia 06-01-11  . Hypertension   . Sinus disorder 06-01-11   allergies chronic/ sinus issues    SURGICAL HISTORY: Past Surgical History:  Procedure Laterality Date  . MANDIBLE FRACTURE SURGERY  06-01-11    Rt. lower jaw-retained hardware.  . ROBOT ASSISTED LAPAROSCOPIC RADICAL PROSTATECTOMY  06/09/2011   Procedure: ROBOTIC ASSISTED LAPAROSCOPIC RADICAL PROSTATECTOMY;  Surgeon: Bernestine Amass, MD;  Location: [facility];  Service: Urology;  Laterality: Bilateral;  with Bilateral Lymph Node Dessection  . WISDOM TOOTH EXTRACTION  06-01-11   all    SOCIAL HISTORY: Social History   Socioeconomic History  . Marital status: Married    Spouse name: Not on file  . Number of children: Not on file  . Years of education: Not on file  . Highest education level: Not on file  Occupational History  . Not on file  Tobacco Use  . Smoking status: Never Smoker  . Smokeless tobacco: Not on file  Substance and Sexual Activity  . Alcohol use: Yes    Alcohol/week: 6.0 standard drinks    Types: 6 Shots of liquor per week    Comment: 1/2 gallon per week  . Drug use: No  . Sexual activity: Yes  Other Topics Concern  . Not on file  Social History Narrative  . Not on file   Social Determinants of Health   Financial Resource Strain: Not on file  Food Insecurity: Not on file  Transportation Needs: Not on file  Physical Activity: Not on file  Stress: Not on file  Social Connections: Not on file  Intimate Partner Violence: Not on file    FAMILY HISTORY: No family history on file.  ALLERGIES:  is  allergic to atorvastatin and codeine.  MEDICATIONS:  Current Outpatient Medications  Medication Sig Dispense Refill  . Chromium-Cinnamon [phone] MCG-MG CAPS Take by mouth.    Marland Kitchen acyclovir (ZOVIRAX) 200 MG capsule Take 200 mg by mouth every 4 (four) hours while awake.      . allopurinol (ZYLOPRIM) 300 MG tablet Take 300 mg by mouth every morning.  (Patient not taking: Reported on 06/30/2020)    . ALPRAZolam (XANAX) 0.5 MG tablet Take 0.5 mg by mouth daily.     . Cholecalciferol (VITAMIN D3) 2000 UNITS TABS Take 1 capsule by mouth every morning.     Marland Kitchen esomeprazole (NEXIUM) 20 MG capsule Take 20 mg by mouth daily  at 12 noon.    . fexofenadine (ALLEGRA) 60 MG tablet Take 60 mg by mouth daily.    . hydrOXYzine (ATARAX/VISTARIL) 25 MG tablet TAKE 1 TO 3 TABS AT BEDTIME AS NEEDED FOR SLEEP    . losartan (COZAAR) 100 MG tablet Take 100 mg by mouth daily.    . OMEGA 3 1200 MG CAPS Take 1 capsule by mouth every morning.       No current facility-administered medications for this visit.    REVIEW OF SYSTEMS:   Constitutional: ( - ) fevers, ( - )  chills , ( - ) night sweats Eyes: ( - ) blurriness of vision, ( - ) double vision, ( - ) watery eyes Ears, nose, mouth, throat, and face: ( - ) mucositis, ( - ) sore throat Respiratory: ( - ) cough, ( - ) dyspnea, ( - ) wheezes Cardiovascular: ( - ) palpitation, ( - ) chest discomfort, ( - ) lower extremity swelling Gastrointestinal:  ( - ) nausea, ( - ) heartburn, ( - ) change in bowel habits Skin: ( - ) abnormal skin rashes Lymphatics: ( - ) new lymphadenopathy, ( - ) easy bruising Neurological: ( - ) numbness, ( - ) tingling, ( - ) new weaknesses Behavioral/Psych: ( - ) mood change, ( - ) new changes  All other systems were reviewed with the patient and are negative.  PHYSICAL EXAMINATION: ECOG PERFORMANCE STATUS: 0 - Asymptomatic  Vitals:   10/01/20 1451  BP: (!) 142/86  Pulse: 77  Resp: 16  Temp: (!) 97.4 F (36.3 C)  SpO2: 98%   Filed Weights   10/01/20 1451  Weight: 174 lb 9.6 oz (79.2 kg)    GENERAL: alert, no distress and comfortable SKIN: skin color, texture, turgor are normal, no rashes or significant lesions EYES: conjunctiva are pink and non-injected, sclera clear LUNGS: clear to auscultation and percussion with normal breathing effort HEART: regular rate & rhythm and no murmurs and no lower extremity edema Musculoskeletal: no cyanosis of digits and no clubbing  PSYCH: alert & oriented x 3, fluent speech NEURO: no focal motor/sensory deficits  LABORATORY DATA:  I have reviewed the data as listed CBC Latest Ref Rng & Units  10/01/2020 06/30/2020 06/10/2011  WBC 4.0 - 10.5 K/uL 5.9 5.3 -  Hemoglobin 13.0 - 17.0 g/dL 14.7 14.0 11.5(L)  Hematocrit 39.0 - 52.0 % 40.8 39.0 33.4(L)  Platelets 150 - 400 K/uL 86(L) 86(L) -    CMP Latest Ref Rng & Units 10/01/2020 06/30/2020 06/10/2011  Glucose 70 - 99 mg/dL 194(H) 112(H) 123(H)  BUN 8 - 23 mg/dL 22 20 10   Creatinine 0.61 - 1.24 mg/dL 1.16 1.14 1.25  Sodium 135 - 145 mmol/L 140 142 140  Potassium 3.5 - 5.1 mmol/L 4.4 4.3 3.6  Chloride  98 - 111 mmol/L 104 107 108  CO2 22 - 32 mmol/L 26 28 27   Calcium 8.9 - 10.3 mg/dL 9.5 9.6 8.3(L)  Total Protein 6.5 - 8.1 g/dL 7.4 7.3 -  Total Bilirubin 0.3 - 1.2 mg/dL 1.1 0.9 -  Alkaline Phos 38 - 126 U/L 98 125 -  AST 15 - 41 U/L 38 34 -  ALT 0 - 44 U/L 44 40 -    RADIOGRAPHIC STUDIES: No results found.  ASSESSMENT & PLAN Bobby Gamble 65 y.o. male with medical history significant for thrombocytopenia who presents for a follow up visit.  At this time I do believe the most likely etiology of this patient's thrombocytopenia is liver disease given his heavy alcohol use.  We will effectively try to rule out other diagnoses including the pseudothrombocytopenia, ITP, and bone marrow dysfunction or nutritional deficiency. Today I will order  an ultrasound of the liver and spleen and refer the patient to hepatology if a concerning abnormality is noted.  In the event that the targeted work-up is negative I recommend we proceed with a bone marrow biopsy. The patient voices understanding of the plan moving forward.  #Thrombocytopenia of Unclear Etiology --differential includes pseudothrombocytopenia, liver disease, ITP, or bone marrow dysfunction/nutritional deficiency. --negative Hep B, C with normal CMP/liver function --assessed for platelet clumping with citrate, immature plt fraction, and peripheral smear. No concern for clumping --given there is no clear etiology recommend proceeding with US liver/spleen. If no findings there would  consider bone marrow biopsy --RTC in 3 months or sooner if further workup is required.  #Prostate Cancer s/p Resection 2012 --noted to be Gleason 3+4 invasive adenocarcinoma --PSA's followed by PCP --if abnormal PSA or recurrence noted we would be able to follow the patient for this condition in our clinic.    Orders Placed This Encounter  Procedures  . US Abdomen Complete    Standing Status:   Future    Standing Expiration Date:   10/01/2021    Order Specific Question:   Reason for Exam (SYMPTOM  OR DIAGNOSIS REQUIRED)    Answer:   assess for liver disease/splenomegaly    Order Specific Question:   Preferred imaging location?    Answer:   [facility]    All questions were answered. The patient knows to call the clinic with any problems, questions or concerns.  A total of more than 30 minutes were spent on this encounter and over half of that time was spent on counseling and coordination of care as outlined above.   Ledell Peoples, MD Department of Hematology/Oncology [place] at [facility] Phone: [phone] Pager: [phone] Email: [email or web address]  10/01/2020 3:43 PM

## 2020-10-01 ENCOUNTER — Inpatient Hospital Stay: Payer: Self-pay

## 2020-10-01 ENCOUNTER — Other Ambulatory Visit: Payer: Self-pay

## 2020-10-01 ENCOUNTER — Inpatient Hospital Stay: Payer: Self-pay | Attending: Hematology and Oncology | Admitting: Hematology and Oncology

## 2020-10-01 ENCOUNTER — Encounter: Payer: Self-pay | Admitting: Hematology and Oncology

## 2020-10-01 VITALS — BP 142/86 | HR 77 | Temp 97.4°F | Resp 16 | Ht 66.0 in | Wt 174.6 lb

## 2020-10-01 DIAGNOSIS — D696 Thrombocytopenia, unspecified: Secondary | ICD-10-CM

## 2020-10-01 DIAGNOSIS — C61 Malignant neoplasm of prostate: Secondary | ICD-10-CM

## 2020-10-01 DIAGNOSIS — Z8546 Personal history of malignant neoplasm of prostate: Secondary | ICD-10-CM | POA: Insufficient documentation

## 2020-10-01 LAB — CBC WITH DIFFERENTIAL (CANCER CENTER ONLY)
Abs Immature Granulocytes: 0.01 10*3/uL (ref 0.00–0.07)
Basophils Absolute: 0.1 10*3/uL (ref 0.0–0.1)
Basophils Relative: 1 %
Eosinophils Absolute: 0.2 10*3/uL (ref 0.0–0.5)
Eosinophils Relative: 3 %
HCT: 40.8 % (ref 39.0–52.0)
Hemoglobin: 14.7 g/dL (ref 13.0–17.0)
Immature Granulocytes: 0 %
Lymphocytes Relative: 36 %
Lymphs Abs: 2.1 10*3/uL (ref 0.7–4.0)
MCH: 34.3 pg — ABNORMAL HIGH (ref 26.0–34.0)
MCHC: 36 g/dL (ref 30.0–36.0)
MCV: 95.3 fL (ref 80.0–100.0)
Monocytes Absolute: 0.6 10*3/uL (ref 0.1–1.0)
Monocytes Relative: 10 %
Neutro Abs: 3 10*3/uL (ref 1.7–7.7)
Neutrophils Relative %: 50 %
Platelet Count: 86 10*3/uL — ABNORMAL LOW (ref 150–400)
RBC: 4.28 MIL/uL (ref 4.22–5.81)
RDW: 13 % (ref 11.5–15.5)
WBC Count: 5.9 10*3/uL (ref 4.0–10.5)
nRBC: 0 % (ref 0.0–0.2)

## 2020-10-01 LAB — CMP (CANCER CENTER ONLY)
ALT: 44 U/L (ref 0–44)
AST: 38 U/L (ref 15–41)
Albumin: 4.1 g/dL (ref 3.5–5.0)
Alkaline Phosphatase: 98 U/L (ref 38–126)
Anion gap: 10 (ref 5–15)
BUN: 22 mg/dL (ref 8–23)
CO2: 26 mmol/L (ref 22–32)
Calcium: 9.5 mg/dL (ref 8.9–10.3)
Chloride: 104 mmol/L (ref 98–111)
Creatinine: 1.16 mg/dL (ref 0.61–1.24)
GFR, Estimated: >60 mL/min (ref >60)
Glucose, Bld: 194 mg/dL — ABNORMAL HIGH (ref 70–99)
Potassium: 4.4 mmol/L (ref 3.5–5.1)
Sodium: 140 mmol/L (ref 135–145)
Total Bilirubin: 1.1 mg/dL (ref 0.3–1.2)
Total Protein: 7.4 g/dL (ref 6.5–8.1)

## 2020-10-01 LAB — LACTATE DEHYDROGENASE: LDH: 186 U/L (ref 98–192)

## 2020-10-01 LAB — SAVE SMEAR(SSMR), FOR PROVIDER SLIDE REVIEW

## 2020-10-02 ENCOUNTER — Telehealth: Payer: Self-pay | Admitting: Hematology and Oncology

## 2020-10-02 NOTE — Telephone Encounter (Signed)
Scheduled per los. Called and spoke with patients wife. Confirmed appt  

## 2020-10-10 ENCOUNTER — Other Ambulatory Visit: Payer: Self-pay

## 2020-10-10 ENCOUNTER — Ambulatory Visit (HOSPITAL_COMMUNITY)
Admission: RE | Admit: 2020-10-10 | Discharge: 2020-10-10 | Disposition: A | Discharge status: Home / Self Care | Payer: Self-pay | Source: Ambulatory Visit | Attending: Hematology and Oncology | Admitting: Hematology and Oncology

## 2020-10-10 DIAGNOSIS — D696 Thrombocytopenia, unspecified: Secondary | ICD-10-CM | POA: Insufficient documentation

## 2020-10-21 ENCOUNTER — Other Ambulatory Visit: Payer: Self-pay | Admitting: Hematology and Oncology

## 2020-10-21 DIAGNOSIS — D696 Thrombocytopenia, unspecified: Secondary | ICD-10-CM

## 2020-10-21 NOTE — Progress Notes (Signed)
a 

## 2020-10-24 ENCOUNTER — Telehealth: Payer: Self-pay

## 2020-10-24 NOTE — Telephone Encounter (Signed)
I contacted [facility] today to touch base with them regarding the referral that was sent by Dr. Lorenso Courier to have the patient evaluated. According to Providence Crosby she confirmed that they received the referral on 10/21/20 and she advised me that their office would be reaching out to the patient today to schedule a consultation.

## 2020-10-28 ENCOUNTER — Encounter: Payer: Self-pay | Admitting: Gastroenterology

## 2020-10-28 NOTE — Telephone Encounter (Signed)
Patient is scheduled 12/03/2020 @ 10:20am with Dr. Loletha Carrow

## 2020-12-03 ENCOUNTER — Encounter: Payer: Self-pay | Admitting: Gastroenterology

## 2020-12-03 ENCOUNTER — Ambulatory Visit: Payer: Self-pay | Admitting: Gastroenterology

## 2020-12-03 ENCOUNTER — Other Ambulatory Visit (INDEPENDENT_AMBULATORY_CARE_PROVIDER_SITE_OTHER): Payer: Self-pay

## 2020-12-03 VITALS — BP 142/84 | HR 75 | Ht 66.0 in | Wt 174.6 lb

## 2020-12-03 DIAGNOSIS — D696 Thrombocytopenia, unspecified: Secondary | ICD-10-CM

## 2020-12-03 DIAGNOSIS — K7030 Alcoholic cirrhosis of liver without ascites: Secondary | ICD-10-CM

## 2020-12-03 DIAGNOSIS — K769 Liver disease, unspecified: Secondary | ICD-10-CM

## 2020-12-03 LAB — COMPREHENSIVE METABOLIC PANEL
ALT: 38 U/L (ref 0–53)
AST: 39 U/L — ABNORMAL HIGH (ref 0–37)
Albumin: 4.2 g/dL (ref 3.5–5.2)
Alkaline Phosphatase: 105 U/L (ref 39–117)
BUN: 26 mg/dL — ABNORMAL HIGH (ref 6–23)
CO2: 29 mEq/L (ref 19–32)
Calcium: 10 mg/dL (ref 8.4–10.5)
Chloride: 102 mEq/L (ref 96–112)
Creatinine, Ser: 1.3 mg/dL (ref 0.40–1.50)
GFR: 58.79 mL/min — ABNORMAL LOW (ref >60.00)
Glucose, Bld: 168 mg/dL — ABNORMAL HIGH (ref 70–99)
Potassium: 4.6 mEq/L (ref 3.5–5.1)
Sodium: 139 mEq/L (ref 135–145)
Total Bilirubin: 1.5 mg/dL — ABNORMAL HIGH (ref 0.2–1.2)
Total Protein: 7.3 g/dL (ref 6.0–8.3)

## 2020-12-03 LAB — IBC + FERRITIN
Ferritin: 645.1 ng/mL — ABNORMAL HIGH (ref 22.0–322.0)
Iron: 145 ug/dL (ref 42–165)
Saturation Ratios: 44.5 % (ref 20.0–50.0)
Transferrin: 233 mg/dL (ref 212.0–360.0)

## 2020-12-03 NOTE — Progress Notes (Signed)
[facility] Consult Note:  History: Bobby Gamble 12/03/2020  Referring provider: Dr. Narda Rutherford (hematology)  Reason for consult/chief complaint: abnormal ultrasound (Patient here for further discussion of abnormal ultrasound showing possible cirrhosis and lesion on liver. Has had low platelet count for over 1 year. )   Subjective  HPI:  This is a 65 year old man accompanied by his wife and referred by hematology for thrombocytopenia and concern for cirrhosis. He was referred by his primary care provider to Dr. Lorenso Courier for thrombocytopenia, he had some additional testing and then was referred to [place].  He has had heavy alcohol use at times over many years, sometimes up to half gallon of liquor in a week.  He has more recently decreased his alcohol use to perhaps 2 drinks a day. Colonoscopy with Dr. Earlean Shawl in 2020 with adenomatous polyps.  He was offered hemorrhoidal banding but declined. Patient has no hematemesis, black or bloody stool.  He denies abdominal pain nausea vomiting dysphagia odynophagia or loss of appetite.  He has not noticed increasing abdominal girth.  Works full-time as an Clinical biochemist. ROS:  Review of Systems  Constitutional: Positive for fatigue. Negative for appetite change and unexpected weight change.  HENT: Negative for mouth sores and voice change.   Eyes: Negative for pain and redness.  Respiratory: Positive for cough. Negative for shortness of breath.   Cardiovascular: Negative for chest pain and palpitations.  Genitourinary: Negative for dysuria and hematuria.  Musculoskeletal: Negative for arthralgias and myalgias.  Skin: Negative for pallor and rash.  Allergic/Immunologic: Positive for environmental allergies.  Neurological: Positive for headaches. Negative for weakness.       Insomnia, which he cites as his main reason for alcohol use.  Depression and anxiety  Hematological: Negative for adenopathy.     Past Medical  History: Past Medical History:  Diagnosis Date  . Anxiety 06-01-11   tx. Xanax  . Arthritis 06-01-11   hx. Gout-tx. Allopurinol  . Cancer ([place]) 06-01-11   Bx.  with dx. 5 weeks ago-Prostate   . GERD (gastroesophageal reflux disease) 06-01-11   tx. Prilosec  . History of colon polyps   . Hyperlipidemia 06-01-11  . Hypertension   . Sinus disorder 06-01-11   allergies chronic/ sinus issues     Past Surgical History: Past Surgical History:  Procedure Laterality Date  . COLONOSCOPY  2020   Dr Earlean Shawl   . MANDIBLE FRACTURE SURGERY  06-01-11   Rt. lower jaw-retained hardware.  . ROBOT ASSISTED LAPAROSCOPIC RADICAL PROSTATECTOMY  06/09/2011   Procedure: ROBOTIC ASSISTED LAPAROSCOPIC RADICAL PROSTATECTOMY;  Surgeon: Bernestine Amass, MD;  Location: [facility];  Service: Urology;  Laterality: Bilateral;  with Bilateral Lymph Node Dessection  . WISDOM TOOTH EXTRACTION  06-01-11   all     Family History: Family History  Problem Relation Age of Onset  . Liver disease Father        unsure of what type  . Esophageal cancer Neg Hx   . Colon cancer Neg Hx   . Pancreatic cancer Neg Hx   . Pancreatic disease Neg Hx    His father was diagnosed with an unknown liver condition and died shortly thereafter.  Social History: Social History   Socioeconomic History  . Marital status: Married    Spouse name: Not on file  . Number of children: Not on file  . Years of education: Not on file  . Highest education level: Not on file  Occupational History  . Not on file  Tobacco Use  . Smoking status: Former Research scientist (life sciences)  . Smokeless tobacco: Never Used  Substance and Sexual Activity  . Alcohol use: Yes    Alcohol/week: 6.0 standard drinks    Types: 6 Shots of liquor per week  . Drug use: No  . Sexual activity: Yes  Other Topics Concern  . Not on file  Social History Narrative  . Not on file   Social Determinants of Health   Financial Resource Strain: Not on file  Food Insecurity: Not on file   Transportation Needs: Not on file  Physical Activity: Not on file  Stress: Not on file  Social Connections: Not on file    Allergies: Allergies  Allergen Reactions  . Atorvastatin Other (See Comments)  . Codeine Nausea And Vomiting    Outpatient Meds: Current Outpatient Medications  Medication Sig Dispense Refill  . acyclovir (ZOVIRAX) 200 MG capsule Take 200 mg by mouth every 4 (four) hours while awake.    . allopurinol (ZYLOPRIM) 300 MG tablet Take 300 mg by mouth every morning.    Marland Kitchen ALPRAZolam (XANAX) 0.5 MG tablet Take 0.5 mg by mouth daily.    . Cholecalciferol (VITAMIN D3) 2000 UNITS TABS Take 2 capsules by mouth every morning.    . Chromium-Cinnamon [phone] MCG-MG CAPS Take by mouth.    . esomeprazole (NEXIUM) 20 MG capsule Take 20 mg by mouth daily at 12 noon.    . fexofenadine (ALLEGRA) 60 MG tablet Take 60 mg by mouth daily.    . hydrOXYzine (ATARAX/VISTARIL) 25 MG tablet TAKE 1 TO 3 TABS AT BEDTIME AS NEEDED FOR SLEEP    . losartan (COZAAR) 100 MG tablet Take 100 mg by mouth daily.    . OMEGA 3 1200 MG CAPS Take 1 capsule by mouth every morning.     No current facility-administered medications for this visit.      ___________________________________________________________________ Objective   Exam:  BP (!) 142/84   Pulse 75   Ht 5\' 6"  (1.676 m)   Wt 174 lb 9.6 oz (79.2 kg)   BMI 28.18 kg/m  Wt Readings from Last 3 Encounters:  12/03/20 174 lb 9.6 oz (79.2 kg)  10/01/20 174 lb 9.6 oz (79.2 kg)  06/30/20 173 lb 1.6 oz (78.5 kg)   Wife present for entire visit.  General: Not acutely ill-appearing.  Ruddy complexion.  Eyes: sclera anicteric, no redness  ENT: oral mucosa moist without lesions, no cervical or supraclavicular lymphadenopathy  CV: RRR without murmur, S1/S2, no JVD, trace pretibial edema bilaterally  Resp: clear to auscultation bilaterally, normal RR and effort noted  GI: soft, no tenderness, with active bowel sounds.  No bulging  flanks  Spleen tip palpable, left lobe liver enlarged.  Skin; warm and dry, no rash or jaundice noted.  No spider nevi  Neuro: awake, alert and oriented x 3. Normal gross motor function and fluent speech No asterixis Labs:  CBC Latest Ref Rng & Units 10/01/2020 06/30/2020 06/10/2011  WBC 4.0 - 10.5 K/uL 5.9 5.3 -  Hemoglobin 13.0 - 17.0 g/dL 14.7 14.0 11.5(L)  Hematocrit 39.0 - 52.0 % 40.8 39.0 33.4(L)  Platelets 150 - 400 K/uL 86(L) 86(L) -   CMP Latest Ref Rng & Units 10/01/2020 06/30/2020 06/10/2011  Glucose 70 - 99 mg/dL 194(H) 112(H) 123(H)  BUN 8 - 23 mg/dL 22 20 10   Creatinine 0.61 - 1.24 mg/dL 1.16 1.14 1.25  Sodium 135 - 145 mmol/L 140 142 140  Potassium 3.5 - 5.1 mmol/L 4.4 4.3 3.6  Chloride 98 - 111 mmol/L 104 107 108  CO2 22 - 32 mmol/L 26 28 27   Calcium 8.9 - 10.3 mg/dL 9.5 9.6 8.3(L)  Total Protein 6.5 - 8.1 g/dL 7.4 7.3 -  Total Bilirubin 0.3 - 1.2 mg/dL 1.1 0.9 -  Alkaline Phos 38 - 126 U/L 98 125 -  AST 15 - 41 U/L 38 34 -  ALT 0 - 44 U/L 44 40 -   Neg Hep B Sag, Sab and neg HCV Ab 06/2020  Radiologic Studies:  CLINICAL DATA:  Liver disease/splenomegaly   EXAM: ABDOMEN ULTRASOUND COMPLETE   COMPARISON:  CT abdomen pelvis 04/30/2009   FINDINGS: Gallbladder: No gallstones or wall thickening visualized. No sonographic Murphy sign noted by sonographer.   Common bile duct: Diameter: 0.3 cm, within normal limits   Liver: There is an indeterminate hypoechoic area the left liver measuring 0.8 x 0.9 x 0.8 cm. Liver contour appears mildly nodular with coarsened echotexture. Portal vein is patent on color Doppler imaging with normal direction of blood flow towards the liver.   IVC: No abnormality visualized.   Pancreas: Poorly visualized due to shadowing bowel gas. No definite abnormality   Spleen: Size and appearance within normal limits.   Right Kidney: Length: 11.3 cm. Echogenicity within normal limits. No mass or hydronephrosis visualized.   Left  Kidney: Length: 10.1 cm. Echogenicity within normal limits. No mass or hydronephrosis visualized. There is a 7 mm shadowing focus in the superior pole, likely a renal calculus.   Abdominal aorta: No aneurysm visualized.   Other findings: None.   IMPRESSION: 1. The liver demonstrates a coarsened echotexture with mildly nodular contour, raising the possibility cirrhosis. There is an indeterminate hypoechoic area in the left liver measuring 0.9 cm. Further evaluation with contrast-enhanced MRI is recommended.   2.  Normal size of the spleen.   3.  Probable nonobstructing 7 mm calculus in the left kidney.     Electronically Signed   By: Audie Pinto M.D.   On: 10/11/2020 18:30   Assessment: Encounter Diagnoses  Name Primary?  . Alcoholic cirrhosis of liver without ascites ([place]) Yes  . Thrombocytopenia ([place])   . Liver lesion     This patient has cirrhosis, most likely related to heavy alcohol use.  Hepatitis B and C-, autoimmune conditions will be tested for but are considered less likely.  Iron studies will be checked as well, though if elevated, more likely secondary iron overload rather than hemochromatosis.  LFTs remain normal.  Small liver lesion of uncertain significance, MRI liver with and without contrast ordered for further evaluation. He and his wife do not carry medical insurance, and self-pay for their care, and they feel they are able to do that for this as well. I was frank with them about my suspected diagnosis of cirrhosis and how he must become completely abstinent from alcohol.  Rather than stop suddenly, he should gradually stop alcohol over the next week.  We discussed potential complication such as ascites, hepatic encephalopathy, GI bleeding and gastroesophageal varices. There is also increased risk of liver cancer, though the small lesion above is of uncertain significance.  Plan:  CBC, CMP, INR, alpha-fetoprotein, iron studies and autoimmune liver  labs  MRI with and without contrast as noted above  We will regroup after that testing, and he is also aware he will need an eventual upper endoscopy to screen for esophageal varices.  Thank you for the courtesy of this consult.  Please call me with any  questions or concerns.  Nelida Meuse III  CC: Referring provider noted above Also Dr. Merrilee Seashore

## 2020-12-03 NOTE — Patient Instructions (Signed)
If you are age 63 or older, your body mass index should be between 23-30. Your Body mass index is 28.18 kg/m. If this is out of the aforementioned range listed, please consider follow up with your Primary Care Provider.  If you are age 52 or younger, your body mass index should be between 19-25. Your Body mass index is 28.18 kg/m. If this is out of the aformentioned range listed, please consider follow up with your Primary Care Provider.   Your provider has requested that you go to the basement level for lab work before leaving today. Press "B" on the elevator. The lab is located at the first door on the left as you exit the elevator.  Due to recent changes in healthcare laws, you may see the results of your imaging and laboratory studies on MyChart before your provider has had a chance to review them.  We understand that in some cases there may be results that are confusing or concerning to you. Not all laboratory results come back in the same time frame and the provider may be waiting for multiple results in order to interpret others.  Please give [place] 48 hours in order for your provider to thoroughly review all the results before contacting the office for clarification of your results.   You have been scheduled for an MRI at [facility] on 12-11-2020. Your appointment time is 10am. Please arrive 15 minutes prior to your appointment time for registration purposes. Please make certain not to have anything to eat or drink 6 hours prior to your test. In addition, if you have any metal in your body, have a pacemaker or defibrillator, please be sure to let your ordering physician know. This test typically takes 45 minutes to 1 hour to complete. Should you need to reschedule, please call [phone] to do so.  It was a pleasure to see you today!  Thank you for trusting me with your gastrointestinal care!

## 2020-12-05 LAB — AFP TUMOR MARKER: AFP-Tumor Marker: 7.4 ng/mL — ABNORMAL HIGH (ref <6.1)

## 2020-12-05 LAB — ANTI-SMOOTH MUSCLE ANTIBODY, IGG: Actin (Smooth Muscle) Antibody (IGG): <20 U (ref <20)

## 2020-12-05 LAB — ANA: Anti Nuclear Antibody (ANA): POSITIVE — AB

## 2020-12-05 LAB — MITOCHONDRIAL ANTIBODIES: Mitochondrial M2 Ab, IgG: <=20 U

## 2020-12-05 LAB — ALPHA-1-ANTITRYPSIN: A-1 Antitrypsin, Ser: 183 mg/dL (ref 83–199)

## 2020-12-05 LAB — ANTI-NUCLEAR AB-TITER (ANA TITER): ANA Titer 1: 1:80 {titer} — ABNORMAL HIGH

## 2020-12-11 ENCOUNTER — Other Ambulatory Visit: Payer: Self-pay

## 2020-12-11 ENCOUNTER — Ambulatory Visit (HOSPITAL_COMMUNITY)
Admission: RE | Admit: 2020-12-11 | Discharge: 2020-12-11 | Disposition: A | Discharge status: Home / Self Care | Payer: Self-pay | Source: Ambulatory Visit | Attending: Gastroenterology | Admitting: Gastroenterology

## 2020-12-11 DIAGNOSIS — K769 Liver disease, unspecified: Secondary | ICD-10-CM | POA: Insufficient documentation

## 2020-12-11 DIAGNOSIS — D696 Thrombocytopenia, unspecified: Secondary | ICD-10-CM | POA: Insufficient documentation

## 2020-12-11 DIAGNOSIS — K7030 Alcoholic cirrhosis of liver without ascites: Secondary | ICD-10-CM | POA: Insufficient documentation

## 2020-12-11 MED ORDER — GADOBUTROL 1 MMOL/ML IV SOLN
8.0000 mL | Freq: Once | INTRAVENOUS | Status: AC | PRN
  Administered 2020-12-11: 8 mL via INTRAVENOUS

## 2020-12-31 ENCOUNTER — Telehealth: Payer: Self-pay | Admitting: *Deleted

## 2020-12-31 NOTE — Telephone Encounter (Signed)
Received call from pt's wife regarding appt that is scheduled for this week with Dr. Lorenso Courier.  Pt is seeing Dr. Loletha Carrow with GI.  She is not sure if her husband needs to come back here.  Spoke with Dr. Lorenso Courier. He states that since pt is being followed by GI, he does not need to come to this clinic.  Wife made aware

## 2021-01-02 ENCOUNTER — Ambulatory Visit: Payer: Self-pay | Admitting: Hematology and Oncology

## 2021-01-02 ENCOUNTER — Other Ambulatory Visit: Payer: Self-pay

## 2021-01-20 ENCOUNTER — Other Ambulatory Visit: Payer: Self-pay

## 2021-01-20 ENCOUNTER — Ambulatory Visit (AMBULATORY_SURGERY_CENTER): Payer: Self-pay

## 2021-01-20 VITALS — Ht 66.5 in | Wt 165.0 lb

## 2021-01-20 DIAGNOSIS — K769 Liver disease, unspecified: Secondary | ICD-10-CM

## 2021-01-20 DIAGNOSIS — D696 Thrombocytopenia, unspecified: Secondary | ICD-10-CM

## 2021-01-20 DIAGNOSIS — K7030 Alcoholic cirrhosis of liver without ascites: Secondary | ICD-10-CM

## 2021-01-20 NOTE — Progress Notes (Signed)
Pre visit completed via phone call;  Patient verified name, DOB, and address; No egg or soy allergy known to patient  No issues with past sedation with any surgeries or procedures Patient denies ever being told they had issues or difficulty with intubation  No FH of Malignant Hyperthermia No diet pills per patient No home 02 use per patient  No blood thinners per patient  Pt denies issues with constipation  No A fib or A flutter  EMMI video via MyChart  COVID 19 guidelines implemented in [place] today with Pt and RN   Due to the COVID-19 pandemic we are asking patients to follow certain guidelines.  Pt aware of COVID protocols and [facility] guidelines   Patient denies loose or missing teeth, dentures, partials, dental implants;   Patient reports capped and bonded teeth;  Lower jaw surgery-retained hardware from fracture-

## 2021-01-30 ENCOUNTER — Ambulatory Visit (AMBULATORY_SURGERY_CENTER): Payer: Self-pay | Admitting: Gastroenterology

## 2021-01-30 ENCOUNTER — Encounter: Payer: Self-pay | Admitting: Gastroenterology

## 2021-01-30 ENCOUNTER — Other Ambulatory Visit: Payer: Self-pay

## 2021-01-30 VITALS — BP 105/73 | HR 66 | Temp 99.1°F | Resp 15 | Ht 66.5 in | Wt 165.0 lb

## 2021-01-30 DIAGNOSIS — K766 Portal hypertension: Secondary | ICD-10-CM

## 2021-01-30 DIAGNOSIS — I8500 Esophageal varices without bleeding: Secondary | ICD-10-CM

## 2021-01-30 DIAGNOSIS — K317 Polyp of stomach and duodenum: Secondary | ICD-10-CM

## 2021-01-30 DIAGNOSIS — K319 Disease of stomach and duodenum, unspecified: Secondary | ICD-10-CM

## 2021-01-30 DIAGNOSIS — K7030 Alcoholic cirrhosis of liver without ascites: Secondary | ICD-10-CM

## 2021-01-30 MED ORDER — SODIUM CHLORIDE 0.9 % IV SOLN: 500.0000 mL | Freq: Once | INTRAVENOUS | Status: DC

## 2021-01-30 NOTE — Patient Instructions (Signed)
Impression/Recommendations:  Resume previous diet. Continue present medications.  Return to primary care physician within 2-3 weeks.  Recommendation to change current ARB anti-hypertensive to a non-selecti beta blocker.  Return to my office in 4 months.  YOU HAD AN ENDOSCOPIC PROCEDURE TODAY AT [facility]:   Refer to the procedure report that was given to you for any specific questions about what was found during the examination.  If the procedure report does not answer your questions, please call your gastroenterologist to clarify.  If you requested that your care partner not be given the details of your procedure findings, then the procedure report has been included in a sealed envelope for you to review at your convenience later.  YOU SHOULD EXPECT: Some feelings of bloating in the abdomen. Passage of more gas than usual.  Walking can help get rid of the air that was put into your GI tract during the procedure and reduce the bloating. If you had a lower endoscopy (such as a colonoscopy or flexible sigmoidoscopy) you may notice spotting of blood in your stool or on the toilet paper. If you underwent a bowel prep for your procedure, you may not have a normal bowel movement for a few days.  Please Note:  You might notice some irritation and congestion in your nose or some drainage.  This is from the oxygen used during your procedure.  There is no need for concern and it should clear up in a day or so.  SYMPTOMS TO REPORT IMMEDIATELY:  Following upper endoscopy (EGD)  Vomiting of blood or coffee ground material  New chest pain or pain under the shoulder blades  Painful or persistently difficult swallowing  New shortness of breath  Fever of 100F or higher  Black, tarry-looking stools  For urgent or emergent issues, a gastroenterologist can be reached at any hour by calling [phone]. Do not use MyChart messaging for urgent concerns.    DIET:  We do recommend a small  meal at first, but then you may proceed to your regular diet.  Drink plenty of fluids but you should avoid alcoholic beverages for 24 hours.  ACTIVITY:  You should plan to take it easy for the rest of today and you should NOT DRIVE or use heavy machinery until tomorrow (because of the sedation medicines used during the test).    FOLLOW UP: Our staff will call the number listed on your records 48-72 hours following your procedure to check on you and address any questions or concerns that you may have regarding the information given to you following your procedure. If we do not reach you, we will leave a message.  We will attempt to reach you two times.  During this call, we will ask if you have developed any symptoms of COVID 19. If you develop any symptoms (ie: fever, flu-like symptoms, shortness of breath, cough etc.) before then, please call [phone].  If you test positive for Covid 19 in the 2 weeks post procedure, please call and report this information to [place].    If any biopsies were taken you will be contacted by phone or by letter within the next 1-3 weeks.  Please call us at [phone] if you have not heard about the biopsies in 3 weeks.    SIGNATURES/CONFIDENTIALITY: You and/or your care partner have signed paperwork which will be entered into your electronic medical record.  These signatures attest to the fact that that the information above on your After Visit  Summary has been reviewed and is understood.  Full responsibility of the confidentiality of this discharge information lies with you and/or your care-partner.

## 2021-01-30 NOTE — Op Note (Signed)
[place] [facility] Patient Name: Bobby Gamble Procedure Date: 01/30/2021 9:49 AM MRN: [id number] Endoscopist: Mallie Mussel L. Loletha Carrow , MD Age: 65 Referring MD:  Date of Birth: 05-13-1956 Gender: Male Account #: [id number] Procedure:                Upper GI endoscopy Indications:              Cirrhosis with suspected esophageal varices Medicines:                Monitored Anesthesia Care Procedure:                Pre-Anesthesia Assessment:                           - Prior to the procedure, a History and Physical                            was performed, and patient medications and                            allergies were reviewed. The patient's tolerance of                            previous anesthesia was also reviewed. The risks                            and benefits of the procedure and the sedation                            options and risks were discussed with the patient.                            All questions were answered, and informed consent                            was obtained. Prior Anticoagulants: The patient has                            taken no previous anticoagulant or antiplatelet                            agents. ASA Grade Assessment: II - A patient with                            mild systemic disease. After reviewing the risks                            and benefits, the patient was deemed in                            satisfactory condition to undergo the procedure.                           After obtaining informed consent, the endoscope was  passed under direct vision. Throughout the                            procedure, the patient's blood pressure, pulse, and                            oxygen saturations were monitored continuously. The                            GIF [id number] #[id number] was introduced through the                            mouth, and advanced to the second part of duodenum.                            The upper GI  endoscopy was accomplished without                            difficulty. The patient tolerated the procedure                            well. Scope In: Scope Out: Findings:                 Grade II varices were found in the lower third of                            the esophagus.                           Multiple pedunculated and sessile fundic gland                            polyps were found in the gastric fundus and in the                            gastric body.                           Portal hypertensive gastropathy was found in the                            entire examined stomach.                           The exam of the stomach was otherwise normal.                           The cardia and gastric fundus were normal on                            retroflexion. No gastric varices seen.                           The examined duodenum was normal. Complications:  No immediate complications. Estimated Blood Loss:     Estimated blood loss: none. Impression:               - Grade II esophageal varices.                           - Multiple fundic gland polyps.                           - Portal hypertensive gastropathy.                           - Normal examined duodenum.                           - No specimens collected. Recommendation:           - Patient has a contact number available for                            emergencies. The signs and symptoms of potential                            delayed complications were discussed with the                            patient. Return to normal activities tomorrow.                            Written discharge instructions were provided to the                            patient.                           - Resume previous diet.                           - Continue present medications.                           - Return to primary care physician within 2-3                            weeks. Recommendation is to change  current ARB                            anti-hypertensive to a non-selective beta blocker                            such as nadolol or long-acting propranolol for                            primary variceal prophylaxis. Titrate to goal pulse                            of 55-60.                           -  Return to my office in 4 months (follow up                            cirrhosis and [place] screening) Mallie Mussel L. Loletha Carrow, MD 01/30/2021 10:13:09 AM This report has been signed electronically.

## 2021-01-30 NOTE — Progress Notes (Signed)
Report to PACU, RN, vss, BBS= Clear.  

## 2021-01-30 NOTE — Progress Notes (Signed)
Pt's states no medical or surgical changes since previsit or office visit. 

## 2021-02-03 ENCOUNTER — Telehealth: Payer: Self-pay

## 2021-02-03 ENCOUNTER — Telehealth: Payer: Self-pay | Admitting: *Deleted

## 2021-02-03 NOTE — Telephone Encounter (Signed)
  Follow up Call-  Call back number 01/30/2021  Post procedure Call Back phone  # [phone]  Permission to leave phone message Yes  Some recent data might be hidden     Patient questions:  Do you have a fever, pain , or abdominal swelling? No. Pain Score  0 *  Have you tolerated food without any problems? Yes.    Have you been able to return to your normal activities? Yes.    Do you have any questions about your discharge instructions: Diet   No. Medications  No. Follow up visit  No.  Do you have questions or concerns about your Care? No.  Actions: * If pain score is 4 or above: No action needed, pain <4.

## 2021-02-03 NOTE — Telephone Encounter (Signed)
First follow up call attempt.  LVLM

## 2021-04-05 ENCOUNTER — Ambulatory Visit: Admit: 2021-04-05 | Discharge status: Absent without leave | Payer: Self-pay

## 2021-12-05 ENCOUNTER — Emergency Department: Payer: Self-pay

## 2021-12-05 ENCOUNTER — Encounter: Payer: Self-pay | Admitting: Intensive Care

## 2021-12-05 ENCOUNTER — Other Ambulatory Visit: Payer: Self-pay

## 2021-12-05 ENCOUNTER — Observation Stay
Admission: EM | Admit: 2021-12-05 | Discharge: 2021-12-06 | Disposition: A | Discharge status: Home / Self Care | Payer: Self-pay | Attending: Student | Admitting: Student

## 2021-12-05 DIAGNOSIS — E872 Acidosis, unspecified: Secondary | ICD-10-CM | POA: Insufficient documentation

## 2021-12-05 DIAGNOSIS — K766 Portal hypertension: Secondary | ICD-10-CM | POA: Insufficient documentation

## 2021-12-05 DIAGNOSIS — I10 Essential (primary) hypertension: Secondary | ICD-10-CM | POA: Insufficient documentation

## 2021-12-05 DIAGNOSIS — Z87891 Personal history of nicotine dependence: Secondary | ICD-10-CM | POA: Insufficient documentation

## 2021-12-05 DIAGNOSIS — Z79899 Other long term (current) drug therapy: Secondary | ICD-10-CM | POA: Insufficient documentation

## 2021-12-05 DIAGNOSIS — R109 Unspecified abdominal pain: Secondary | ICD-10-CM

## 2021-12-05 DIAGNOSIS — I8501 Esophageal varices with bleeding: Secondary | ICD-10-CM | POA: Insufficient documentation

## 2021-12-05 DIAGNOSIS — I8510 Secondary esophageal varices without bleeding: Secondary | ICD-10-CM

## 2021-12-05 DIAGNOSIS — K920 Hematemesis: Principal | ICD-10-CM | POA: Insufficient documentation

## 2021-12-05 DIAGNOSIS — R1084 Generalized abdominal pain: Principal | ICD-10-CM

## 2021-12-05 DIAGNOSIS — F419 Anxiety disorder, unspecified: Secondary | ICD-10-CM

## 2021-12-05 DIAGNOSIS — K7460 Unspecified cirrhosis of liver: Secondary | ICD-10-CM

## 2021-12-05 DIAGNOSIS — Z20822 Contact with and (suspected) exposure to covid-19: Secondary | ICD-10-CM | POA: Insufficient documentation

## 2021-12-05 DIAGNOSIS — Z8546 Personal history of malignant neoplasm of prostate: Secondary | ICD-10-CM

## 2021-12-05 HISTORY — DX: Unspecified cirrhosis of liver: K74.60

## 2021-12-05 LAB — CBC
HCT: 40.3 % (ref 39.0–52.0)
Hemoglobin: 14.5 g/dL (ref 13.0–17.0)
MCH: 33 pg (ref 26.0–34.0)
MCHC: 36 g/dL (ref 30.0–36.0)
MCV: 91.8 fL (ref 80.0–100.0)
Platelets: 103 10*3/uL — ABNORMAL LOW (ref 150–400)
RBC: 4.39 MIL/uL (ref 4.22–5.81)
RDW: 12.8 % (ref 11.5–15.5)
WBC: 6.3 10*3/uL (ref 4.0–10.5)
nRBC: 0 % (ref 0.0–0.2)

## 2021-12-05 LAB — HEPATIC FUNCTION PANEL
ALT: 44 U/L (ref 0–44)
AST: 46 U/L — ABNORMAL HIGH (ref 15–41)
Albumin: 3.8 g/dL (ref 3.5–5.0)
Alkaline Phosphatase: 133 U/L — ABNORMAL HIGH (ref 38–126)
Bilirubin, Direct: 0.3 mg/dL — ABNORMAL HIGH (ref 0.0–0.2)
Indirect Bilirubin: 1.2 mg/dL — ABNORMAL HIGH (ref 0.3–0.9)
Total Bilirubin: 1.5 mg/dL — ABNORMAL HIGH (ref 0.3–1.2)
Total Protein: 7.3 g/dL (ref 6.5–8.1)

## 2021-12-05 LAB — RESP PANEL BY RT-PCR (FLU A&B, COVID) ARPGX2
Influenza A by PCR: NEGATIVE
Influenza B by PCR: NEGATIVE
SARS Coronavirus 2 by RT PCR: NEGATIVE

## 2021-12-05 LAB — LIPASE, BLOOD: Lipase: 53 U/L — ABNORMAL HIGH (ref 11–51)

## 2021-12-05 LAB — LACTIC ACID, PLASMA
Lactic Acid, Venous: 5.6 mmol/L (ref 0.5–1.9)
Lactic Acid, Venous: 1.9 mmol/L (ref 0.5–1.9)

## 2021-12-05 LAB — BASIC METABOLIC PANEL
Anion gap: 14 (ref 5–15)
BUN: 24 mg/dL — ABNORMAL HIGH (ref 8–23)
CO2: 19 mmol/L — ABNORMAL LOW (ref 22–32)
Calcium: 9.8 mg/dL (ref 8.9–10.3)
Chloride: 105 mmol/L (ref 98–111)
Creatinine, Ser: 1.19 mg/dL (ref 0.61–1.24)
GFR, Estimated: >60 mL/min (ref >60)
Glucose, Bld: 199 mg/dL — ABNORMAL HIGH (ref 70–99)
Potassium: 3.4 mmol/L — ABNORMAL LOW (ref 3.5–5.1)
Sodium: 138 mmol/L (ref 135–145)

## 2021-12-05 LAB — PROTIME-INR
INR: 1.1 (ref 0.8–1.2)
Prothrombin Time: 14.5 seconds (ref 11.4–15.2)

## 2021-12-05 LAB — TROPONIN I (HIGH SENSITIVITY)
Troponin I (High Sensitivity): 5 ng/L (ref <18)
Troponin I (High Sensitivity): 8 ng/L (ref <18)

## 2021-12-05 MED ORDER — HYDROMORPHONE HCL 1 MG/ML IJ SOLN
1.0000 mg | Freq: Once | INTRAMUSCULAR | Status: AC
  Administered 2021-12-05: 1 mg via INTRAVENOUS
  Filled 2021-12-05: qty 1

## 2021-12-05 MED ORDER — ONDANSETRON HCL 4 MG PO TABS
4.0000 mg | ORAL_TABLET | Freq: Four times a day (QID) | ORAL | Status: DC | PRN
  Administered 2021-12-06: 4 mg via ORAL
  Filled 2021-12-05: qty 1

## 2021-12-05 MED ORDER — METOPROLOL TARTRATE 5 MG/5ML IV SOLN: 5.0000 mg | Freq: Four times a day (QID) | INTRAVENOUS | Status: DC | PRN

## 2021-12-05 MED ORDER — HYDROMORPHONE HCL 1 MG/ML IJ SOLN
1.0000 mg | INTRAMUSCULAR | Status: DC | PRN
  Administered 2021-12-05 – 2021-12-06 (×3): 1 mg via INTRAVENOUS
  Filled 2021-12-05 (×3): qty 1

## 2021-12-05 MED ORDER — ONDANSETRON 4 MG PO TBDP
4.0000 mg | ORAL_TABLET | Freq: Once | ORAL | Status: AC | PRN
  Administered 2021-12-05: 4 mg via ORAL
  Filled 2021-12-05: qty 1

## 2021-12-05 MED ORDER — PANTOPRAZOLE 80MG IVPB - SIMPLE MED
80.0000 mg | Freq: Once | INTRAVENOUS | Status: AC
  Administered 2021-12-05: 80 mg via INTRAVENOUS
  Filled 2021-12-05: qty 100

## 2021-12-05 MED ORDER — SODIUM CHLORIDE 0.9 % IV SOLN
2.0000 g | INTRAVENOUS | Status: DC
  Filled 2021-12-05: qty 20

## 2021-12-05 MED ORDER — LACTATED RINGERS IV SOLN: INTRAVENOUS | Status: DC

## 2021-12-05 MED ORDER — ACETAMINOPHEN 650 MG RE SUPP
650.0000 mg | Freq: Four times a day (QID) | RECTAL | Status: DC | PRN
Start: 2021-12-05 — End: 2021-12-06

## 2021-12-05 MED ORDER — ACETAMINOPHEN 325 MG PO TABS: 650.0000 mg | ORAL_TABLET | Freq: Four times a day (QID) | ORAL | Status: DC | PRN

## 2021-12-05 MED ORDER — OCTREOTIDE LOAD VIA INFUSION
50.0000 ug | Freq: Once | INTRAVENOUS | Status: AC
  Administered 2021-12-05: 50 ug via INTRAVENOUS
  Filled 2021-12-05: qty 25

## 2021-12-05 MED ORDER — SODIUM CHLORIDE 0.9 % IV SOLN
50.0000 ug/h | INTRAVENOUS | Status: DC
  Administered 2021-12-05 – 2021-12-06 (×2): 50 ug/h via INTRAVENOUS
  Filled 2021-12-05 (×3): qty 1

## 2021-12-05 MED ORDER — PANTOPRAZOLE INFUSION (NEW) - SIMPLE MED
8.0000 mg/h | INTRAVENOUS | Status: DC
  Administered 2021-12-05 – 2021-12-06 (×2): 8 mg/h via INTRAVENOUS
  Filled 2021-12-05 (×2): qty 100

## 2021-12-05 MED ORDER — SODIUM CHLORIDE 0.9 % IV SOLN
2.0000 g | Freq: Once | INTRAVENOUS | Status: AC
  Administered 2021-12-05: 2 g via INTRAVENOUS
  Filled 2021-12-05: qty 20

## 2021-12-05 MED ORDER — ONDANSETRON HCL 4 MG/2ML IJ SOLN
4.0000 mg | Freq: Once | INTRAMUSCULAR | Status: AC
  Administered 2021-12-05: 4 mg via INTRAVENOUS
  Filled 2021-12-05: qty 2

## 2021-12-05 MED ORDER — LORAZEPAM 2 MG/ML IJ SOLN
0.2500 mg | INTRAMUSCULAR | Status: DC | PRN
  Administered 2021-12-05: 0.25 mg via INTRAVENOUS
  Filled 2021-12-05: qty 1

## 2021-12-05 MED ORDER — ONDANSETRON HCL 4 MG/2ML IJ SOLN: 4.0000 mg | Freq: Four times a day (QID) | INTRAMUSCULAR | Status: DC | PRN

## 2021-12-05 MED ORDER — SODIUM CHLORIDE 0.9 % IV BOLUS
1000.0000 mL | Freq: Once | INTRAVENOUS | Status: AC
  Administered 2021-12-05: 1000 mL via INTRAVENOUS

## 2021-12-05 MED ORDER — IOHEXOL 300 MG/ML  SOLN
100.0000 mL | Freq: Once | INTRAMUSCULAR | Status: AC | PRN
  Administered 2021-12-05: 100 mL via INTRAVENOUS

## 2021-12-05 MED ORDER — SODIUM CHLORIDE 0.9 % IV BOLUS
500.0000 mL | Freq: Once | INTRAVENOUS | Status: AC
  Administered 2021-12-05: 500 mL via INTRAVENOUS

## 2021-12-05 NOTE — H&P (Signed)
?History and Physical  ? ? ?Patient: Bobby Gamble GGE:[id number] DOB: 05-Aug-1955 ?DOA: 12/05/2021 ?DOS: the patient was seen and examined on 12/05/2021 ?PCP: Merrilee Seashore, MD  ?Patient coming from: Home ? ?Chief Complaint:  ?Chief Complaint  ?Patient presents with  ? Abdominal Pain  ? ? ?HPI: Bobby Gamble is a 66 y.o. male with medical history significant for Alcoholic cirrhosis with portal hypertension, grade 2 varices and hypertensive gastropathy on EGD 01/30/2021, history of prostate cancer, who presents to the ED for evaluation of a 2-week history of off-and-on epigastric pain which became acutely worse in the past 24 hours, associated with bloating, radiating to the back, with several episodes of nausea and vomiting, some blood-streaked.  Denies ongoing alcohol use or NSAID use.  Denies fever, chills, dysuria or diarrhea. ?ED course and data review: BP 177/85 with otherwise normal vitals.  WBC 6.3, hemoglobin 14.5.  Lactic acid 5.6-1.9.  AST/ALT 46/44 with alk phos 133 and total bili 1.5.  Lipase 53.  BMP with creatinine 1.19, BUN 24.  Glucose 199 bicarb 19 and potassium 3.4.  EKG, personally viewed and interpreted: Sinus at 68 with nonspecific ST-T wave changes.  Chest x-ray nonacute, CT abdomen and pelvis with contrast showing the following findings ?IMPRESSION: ?1. Cirrhosis.  No focal liver abnormality. ?2. Portal venous hypertension manifested by progressive gastric and ?esophageal varices and splenomegaly. ?3. Sigmoid diverticulosis without acute diverticulitis. ?4.  Aortic Atherosclerosis (ICD10-I70.0). ? ?The ED provider spoke with gastroenterologist, Dr. Vicente Males who recommended n.p.o., starting Protonix and octreotide and admission to hospitalist service.  Patient was treated with Dilaudid IV fluid boluses Protonix bolus and infusion as well as octreotide and ceftriaxone.  Hospitalist consulted.  ? ?Review of Systems: As mentioned in the history of present illness. All other systems reviewed  and are negative. ?Past Medical History:  ?Diagnosis Date  ? Anxiety 06/01/2011  ? tx. Xanax  ? Arthritis 06/01/2011  ? hx. Gout-tx. Allopurinol  ? Cancer ([place]) 06/01/2011  ? Bx.  with dx. 5 weeks ago-Prostate   ? Cirrhosis ([place])   ? GERD (gastroesophageal reflux disease) 06/01/2011  ? tx. Prilosec  ? History of colon polyps   ? Hyperlipidemia 06/01/2011  ? Hypertension   ? Seasonal allergies   ? Sinus disorder 06/01/2011  ? allergies chronic/ sinus issues  ? ?Past Surgical History:  ?Procedure Laterality Date  ? COLONOSCOPY  2020  ? Dr Earlean Shawl   ? MANDIBLE FRACTURE SURGERY  06-01-11  ? Rt. lower jaw-retained hardware.  ? ROBOT ASSISTED LAPAROSCOPIC RADICAL PROSTATECTOMY  06/09/2011  ? Procedure: ROBOTIC ASSISTED LAPAROSCOPIC RADICAL PROSTATECTOMY;  Surgeon: Bernestine Amass, MD;  Location: [facility];  Service: Urology;  Laterality: Bilateral;  with Bilateral Lymph Node Dessection  ? [place] EXTRACTION  06-01-11  ? all  ? ?Social History:  reports that he has quit smoking. He has never used smokeless tobacco. He reports that he does not currently use alcohol after a past usage of about 4.0 standard drinks per week. He reports that he does not use drugs. ? ?Allergies  ?Allergen Reactions  ? Atorvastatin Other (See Comments)  ?  Joints ache  ? Simvastatin   ?  Other reaction(s): muscle aches  ? Codeine Nausea And Vomiting  ? ? ?Family History  ?Problem Relation Age of Onset  ? Liver disease Father   ?     unsure of what type  ? Esophageal cancer Neg Hx   ? Colon cancer Neg Hx   ? Pancreatic cancer Neg  Hx   ? Pancreatic disease Neg Hx   ? Colon polyps Neg Hx   ? Prostate cancer Neg Hx   ? Rectal cancer Neg Hx   ? Stomach cancer Neg Hx   ? ? ?Prior to Admission medications   ?Medication Sig Start Date End Date Taking? Authorizing Provider  ?acyclovir (ZOVIRAX) 200 MG capsule Take 200 mg by mouth every 4 (four) hours as needed.    [provider]  ?allopurinol (ZYLOPRIM) 300 MG tablet Take 300 mg by mouth every  morning. ?Patient not taking: No sig reported    [provider]  ?ALPRAZolam (XANAX) 0.5 MG tablet Take 0.5 mg by mouth daily as needed.    [provider]  ?Cholecalciferol (VITAMIN D3) 2000 UNITS TABS Take 2 capsules by mouth every morning.    [provider]  ?Chromium-Cinnamon [phone] MCG-MG CAPS Take 1 capsule by mouth daily at 6 (six) AM.    [provider]  ?doxycycline (VIBRAMYCIN) 100 MG capsule daily as needed. 08/09/19   [provider]  ?esomeprazole (NEXIUM) 20 MG capsule Take 20 mg by mouth daily at 12 noon.    [provider]  ?fexofenadine (ALLEGRA) 60 MG tablet Take 60 mg by mouth daily.    [provider]  ?hydrOXYzine (ATARAX/VISTARIL) 25 MG tablet TAKE 1 TO 3 TABS AT BEDTIME AS NEEDED FOR SLEEP 08/08/19   [provider]  ?Hyoscyamine Sulfate (HYOSCYAMINE PO) Take 1 tablet by mouth daily as needed.    [provider]  ?losartan (COZAAR) 100 MG tablet Take 100 mg by mouth daily. 04/13/20   [provider]  ?mupirocin ointment (BACTROBAN) 2 % daily as needed.    [provider]  ?OMEGA 3 1200 MG CAPS Take 1 capsule by mouth every morning.    [provider]  ? ? ?Physical Exam: ?Vitals:  ? 12/05/21 1751 12/05/21 1830 12/05/21 1900 12/05/21 2000  ?BP: (!) 135/93 (!) 149/87 139/87 (!) 158/93  ?Pulse: 83 87 85 85  ?Resp: _0 ?Temp:      ?TempSrc:      ?SpO2: 99% 93% 96% 97%  ?Weight:      ?Height:      ? ?Physical Exam ?Vitals and nursing note reviewed.  ?Constitutional:   ?   General: He is not in acute distress. ?HENT:  ?   Head: Normocephalic and atraumatic.  ?Cardiovascular:  ?   Rate and Rhythm: Normal rate and regular rhythm.  ?   Pulses: Normal pulses.  ?   Heart sounds: Normal heart sounds.  ?Pulmonary:  ?   Effort: Pulmonary effort is normal.  ?   Breath sounds: Normal breath sounds.  ?Abdominal:  ?   Palpations: Abdomen is soft.  ?   Tenderness: There is abdominal tenderness in  the epigastric area.  ?Neurological:  ?   Mental Status: Mental status is at baseline.  ? ? ? ?Data Reviewed: ?Relevant notes from primary care and specialist visits, past discharge summaries as available in EHR, including Care Everywhere. ?Prior diagnostic testing as pertinent to current admission diagnoses ?Updated medications and problem lists for reconciliation ?ED course, including vitals, labs, imaging, treatment and response to treatment ?Triage notes, nursing and pharmacy notes and ED provider's notes ?Notable results as noted in HPI ? ? ?Assessment and Plan: ?* Hematemesis ?History of gastritis, hypertensive gastropathy and grade 2 esophageal varices ?Continue octreotide, Protonix infusion, Rocephin ?Serial H&H and transfuse if necessary ?GI consulted ?Keeping n.p.o. tonight and  with close hemodynamic monitoring ? ?Esophageal varices in alcoholic cirrhosis (HCC) ?EGD on 01/30/2021: Grade II varices were found in the lower third of the esophagus. ?- Multiple pedunculated and sessile fundic gland polyps were found in the gastric fundus and ?in the gastric body. ?- Portal hypertensive gastropathy was found in the entire examined stomach ? ?Continued management as above ? ?Lactic acidosis ?Lactic acid improved from 5.6-1.9 with hydration only.  Suspect related to possible GI bleed ? ?Abdominal pain ?Suspect secondary to gastritis ?Pain control ?Continued management as above ? ?Anxiety ?IV Ativan as needed to replace Xanax while n.p.o. ? ?Personal history of prostate cancer ?No acute disease suspected at this time ? ?Hypertension ?Will hold losartan for tonight.  Hydralazine as needed SBP over 175 ? ? ? ? ? ? ?Advance Care Planning:   Code Status: Not on file  ? ?Consults: Gastroenterology, Dr. Vicente Males ? ?Family Communication: none ? ?Severity of Illness: ?The appropriate patient status for this patient is INPATIENT. Inpatient status is judged to be reasonable and necessary in order to provide the required intensity  of service to ensure the patient's safety. The patient's presenting symptoms, physical exam findings, and initial radiographic and laboratory data in the context of their chronic comorbidities is felt

## 2021-12-05 NOTE — Assessment & Plan Note (Signed)
IV Ativan as needed to replace Xanax while n.p.o. ?

## 2021-12-05 NOTE — Assessment & Plan Note (Signed)
Lactic acid improved from 5.6-1.9 with hydration only.  Suspect related to possible GI bleed ?

## 2021-12-05 NOTE — Assessment & Plan Note (Signed)
EGD on 01/30/2021: Grade II varices were found in the lower third of the esophagus. ?- Multiple pedunculated and sessile fundic gland polyps were found in the gastric fundus and ?in the gastric body. ?- Portal hypertensive gastropathy was found in the entire examined stomach ? ?Continued management as above ?

## 2021-12-05 NOTE — Assessment & Plan Note (Addendum)
History of gastritis, hypertensive gastropathy and grade 2 esophageal varices ?Continue octreotide, Protonix infusion, Rocephin ?Serial H&H and transfuse if necessary ?GI consulted ?Keeping n.p.o. tonight and with close hemodynamic monitoring ?

## 2021-12-05 NOTE — Assessment & Plan Note (Signed)
Will hold losartan for tonight.  Hydralazine as needed SBP over 175 ?

## 2021-12-05 NOTE — ED Provider Notes (Signed)
? ?[facility] ?Provider Note ? ? ? Event Date/Time  ? First MD Initiated Contact with Patient 12/05/21 1608   ?  (approximate) ? ? ?History  ? ?Abdominal Pain ? ? ?HPI ? ?Bobby Gamble is a 66 y.o. male with past medical history of cirrhosis with history of varices, here with severe abdominal pain.  The patient states that off-and-on, for the last week or 2, he has had an intermittent bloating, epigastric type discomfort.  He has been taking vinegar with improvement.  Earlier today, he felt this pain come on but it has not gone away.  Is gotten increasingly severe and is now 10 out of 10.  Describes it as an aching, fullness sensation in his epigastric area with some radiation towards his back.  He has felt nauseous and vomited several times.  No blood in his emesis.  Denies any melena.  Denies any history of variceal bleeds but has had varices on prior EGDs.  Denies any NSAID use.  Denies any ongoing alcohol use.  No history of pancreatitis or gallstones. ?  ? ? ?Physical Exam  ? ?Triage Vital Signs: ?ED Triage Vitals  ?Enc Vitals Group  ?   BP 12/05/21 1544 (!) 177/85  ?   Pulse Rate 12/05/21 1544 62  ?   Resp 12/05/21 1544 (!) 22  ?   Temp 12/05/21 1546 98.3 ?F (36.8 ?C)  ?   Temp Source 12/05/21 1544 Oral  ?   SpO2 12/05/21 1544 100 %  ?   Weight 12/05/21 1541 170 lb (77.1 kg)  ?   Height 12/05/21 1541 '5\' 7"'$  (1.702 m)  ?   Head Circumference --   ?   Peak Flow --   ?   Pain Score 12/05/21 1541 10  ?   Pain Loc --   ?   Pain Edu? --   ?   Excl. in [place]? --   ? ? ?Most recent vital signs: ?Vitals:  ? 12/05/21 1830 12/05/21 1900  ?BP: (!) 149/87 139/87  ?Pulse: 87 85  ?Resp: 14 15  ?Temp:    ?SpO2: 93% 96%  ? ? ? ?General: Awake, appears mildly uncomfortable due to pain. ?CV:  Good peripheral perfusion.  Regular rate.  No murmurs. ?Resp:  Normal effort.  Lungs clear to auscultation bilaterally. ?Abd:  No distention.  Moderate epigastric tenderness.  No rebound or guarding. ?Other:  Appears  uncomfortable. ? ? ?ED Results / Procedures / Treatments  ? ?Labs ?(all labs ordered are listed, but only abnormal results are displayed) ?Labs Reviewed  ?BASIC METABOLIC PANEL - Abnormal; Notable for the following components:  ?    Result Value  ? Potassium 3.4 (*)   ? CO2 19 (*)   ? Glucose, Bld 199 (*)   ? BUN 24 (*)   ? All other components within normal limits  ?CBC - Abnormal; Notable for the following components:  ? Platelets 103 (*)   ? All other components within normal limits  ?HEPATIC FUNCTION PANEL - Abnormal; Notable for the following components:  ? AST 46 (*)   ? Alkaline Phosphatase 133 (*)   ? Total Bilirubin 1.5 (*)   ? Bilirubin, Direct 0.3 (*)   ? Indirect Bilirubin 1.2 (*)   ? All other components within normal limits  ?LIPASE, BLOOD - Abnormal; Notable for the following components:  ? Lipase 53 (*)   ? All other components within normal limits  ?LACTIC ACID, PLASMA - Abnormal; Notable  for the following components:  ? Lactic Acid, Venous 5.6 (*)   ? All other components within normal limits  ?RESP PANEL BY RT-PCR (FLU A&B, COVID) [id number]  ?LACTIC ACID, PLASMA  ?PROTIME-INR  ?TROPONIN I (HIGH SENSITIVITY)  ?TROPONIN I (HIGH SENSITIVITY)  ? ? ? ?EKG ?Normal sinus rhythm, ventricular rate 68.  PR 212, QRS 104, QTc 459.  First-degree AV block.  No acute ST elevations or depressions. ? ? ?RADIOLOGY ?CT ab/pelvis: Cirrhosis with significant gastric and esophageal varices ? ? ?I also independently reviewed and agree with radiologist interpretations. ? ? ?PROCEDURES: ? ?Critical Care performed: Yes, see critical care procedure note(s) ? ?.1-3 Lead EKG Interpretation ?Performed by: Duffy Bruce, MD ?Authorized by: Duffy Bruce, MD  ? ?  Interpretation: normal   ?  ECG rate:  80-90 ?  ECG rate assessment: normal   ?  Rhythm: sinus rhythm   ?  Ectopy: none   ?  Conduction: normal   ?Comments:  ?   Indication: Chest pain ?.Critical Care ?Performed by: Duffy Bruce, MD ?Authorized by: Duffy Bruce,  MD  ? ?Critical care provider statement:  ?  Critical care time (minutes):  30 ?  Critical care time was exclusive of:  Separately billable procedures and treating other patients ?  Critical care was necessary to treat or prevent imminent or life-threatening deterioration of the following conditions:  Cardiac failure and circulatory failure ?  Critical care was time spent personally by me on the following activities:  Development of treatment plan with patient or surrogate, discussions with consultants, evaluation of patient's response to treatment, examination of patient, ordering and review of laboratory studies, ordering and review of radiographic studies, ordering and performing treatments and interventions, pulse oximetry, re-evaluation of patient's condition and review of old charts ? ? ? ?MEDICATIONS ORDERED IN ED: ?Medications  ?octreotide (SANDOSTATIN) 2 mcg/mL load via infusion 50 mcg (has no administration in time range)  ?  And  ?octreotide (SANDOSTATIN) 500 mcg in sodium chloride 0.9 % 250 mL (2 mcg/mL) infusion (has no administration in time range)  ?pantoprozole (PROTONIX) 80 mg /NS 100 mL infusion (has no administration in time range)  ?cefTRIAXone (ROCEPHIN) 2 g in sodium chloride 0.9 % 100 mL IVPB (has no administration in time range)  ?ondansetron (ZOFRAN-ODT) disintegrating tablet 4 mg (4 mg Oral Given 12/05/21 1546)  ?HYDROmorphone (DILAUDID) injection 1 mg (1 mg Intravenous Given 12/05/21 1636)  ?ondansetron (ZOFRAN) injection 4 mg (4 mg Intravenous Given 12/05/21 1635)  ?sodium chloride 0.9 % bolus 500 mL (0 mLs Intravenous Stopped 12/05/21 1733)  ?pantoprazole (PROTONIX) 80 mg /NS 100 mL IVPB (0 mg Intravenous Stopped 12/05/21 1820)  ?iohexol (OMNIPAQUE) 300 MG/ML solution 100 mL (100 mLs Intravenous Contrast Given 12/05/21 1649)  ?HYDROmorphone (DILAUDID) injection 1 mg (1 mg Intravenous Given 12/05/21 1753)  ?sodium chloride 0.9 % bolus 1,000 mL (0 mLs Intravenous Stopped 12/05/21 1908)   ? ? ? ?IMPRESSION / MDM / ASSESSMENT AND PLAN / ED COURSE  ?I reviewed the triage vital signs and the nursing notes. ?             ?               ? ? ?The patient is on the cardiac monitor to evaluate for evidence of arrhythmia and/or significant heart rate changes. ? ? ?Ddx:  ?Differential includes the following, with pertinent life- or limb-threatening emergencies considered: ? ?Gastritis, PUD, esophagitis, variceal bleed, volvulus, pancreatitis, cholecystitis, colitis. Less likely ACS/angina. Pain not  c/f dissection or PE clinically. ? ? ?MDM:  ?66 year old male with past medical history as above here with acute, severe epigastric abdominal pain.  Patient has history of cirrhosis and varices.  He did have some blood-streaked emesis but no active bleeding currently.  He is hemodynamically stable.  Lab work shows stable hemoglobin of 14.5.  Mild thrombocytopenia noted.  CMP with mild AST elevation, bilirubin 1.5, INR pending.  Initial lactic acid 5.6, which I suspect is combination of his vomiting and liver disease. clinically he not appear to be in septic shock.  CT abdomen/pelvis obtained, reviewed, shows no acute surgical abnormality.  He does have significant varices noted.  Discussed the case with Dr. Vicente Males of GI, will place on octreotide drip, continue Protonix, Rocephin, and admit to medicine for observation and possible EGD in the morning.  Patient should remain n.p.o.  Discussed with the patient and his wife, who are in agreement with this plan. ? ? ?MEDICATIONS GIVEN IN ED: ?Medications  ?octreotide (SANDOSTATIN) 2 mcg/mL load via infusion 50 mcg (has no administration in time range)  ?  And  ?octreotide (SANDOSTATIN) 500 mcg in sodium chloride 0.9 % 250 mL (2 mcg/mL) infusion (has no administration in time range)  ?pantoprozole (PROTONIX) 80 mg /NS 100 mL infusion (has no administration in time range)  ?cefTRIAXone (ROCEPHIN) 2 g in sodium chloride 0.9 % 100 mL IVPB (has no administration in time range)   ?ondansetron (ZOFRAN-ODT) disintegrating tablet 4 mg (4 mg Oral Given 12/05/21 1546)  ?HYDROmorphone (DILAUDID) injection 1 mg (1 mg Intravenous Given 12/05/21 1636)  ?ondansetron (ZOFRAN) injection 4 mg (4 mg

## 2021-12-05 NOTE — Assessment & Plan Note (Signed)
No acute disease suspected at this time ?

## 2021-12-05 NOTE — Assessment & Plan Note (Signed)
Suspect secondary to gastritis ?Pain control ?Continued management as above ?

## 2021-12-05 NOTE — ED Triage Notes (Signed)
Patient c/o aching epigastric pain that radiates up to chest with N/V. Reports this has been intermittent X1 month but today worsened. Patient diaphoretic upon arrival ?

## 2021-12-06 DIAGNOSIS — K7460 Unspecified cirrhosis of liver: Secondary | ICD-10-CM

## 2021-12-06 DIAGNOSIS — R109 Unspecified abdominal pain: Secondary | ICD-10-CM

## 2021-12-06 LAB — HIV ANTIBODY (ROUTINE TESTING W REFLEX): HIV Screen 4th Generation wRfx: NONREACTIVE

## 2021-12-06 LAB — MAGNESIUM: Magnesium: 1.7 mg/dL (ref 1.7–2.4)

## 2021-12-06 LAB — PHOSPHORUS: Phosphorus: 2.5 mg/dL (ref 2.5–4.6)

## 2021-12-06 LAB — CBC
HCT: 34.1 % — ABNORMAL LOW (ref 39.0–52.0)
Hemoglobin: 12.3 g/dL — ABNORMAL LOW (ref 13.0–17.0)
MCH: 33.6 pg (ref 26.0–34.0)
MCHC: 36.1 g/dL — ABNORMAL HIGH (ref 30.0–36.0)
MCV: 93.2 fL (ref 80.0–100.0)
Platelets: 66 10*3/uL — ABNORMAL LOW (ref 150–400)
RBC: 3.66 MIL/uL — ABNORMAL LOW (ref 4.22–5.81)
RDW: 13.2 % (ref 11.5–15.5)
WBC: 4.5 10*3/uL (ref 4.0–10.5)
nRBC: 0 % (ref 0.0–0.2)

## 2021-12-06 LAB — BASIC METABOLIC PANEL
Anion gap: 5 (ref 5–15)
BUN: 18 mg/dL (ref 8–23)
CO2: 24 mmol/L (ref 22–32)
Calcium: 8.4 mg/dL — ABNORMAL LOW (ref 8.9–10.3)
Chloride: 109 mmol/L (ref 98–111)
Creatinine, Ser: 1.05 mg/dL (ref 0.61–1.24)
GFR, Estimated: >60 mL/min (ref >60)
Glucose, Bld: 133 mg/dL — ABNORMAL HIGH (ref 70–99)
Potassium: 4 mmol/L (ref 3.5–5.1)
Sodium: 138 mmol/L (ref 135–145)

## 2021-12-06 LAB — HEMOGLOBIN
Hemoglobin: 12.6 g/dL — ABNORMAL LOW (ref 13.0–17.0)
Hemoglobin: 13 g/dL (ref 13.0–17.0)

## 2021-12-06 NOTE — Discharge Summary (Signed)
[facility] Discharge Summary ? ? ?Patient: Bobby Gamble JSE:[id number]  PCP: Merrilee Seashore, MD  ?Date of admission: 12/05/2021   Date of discharge:  12/06/2021   ?  ?Discharge Diagnoses:  ?Principal Problem: ?  Hematemesis ?Active Problems: ?  Esophageal varices in alcoholic cirrhosis ([place]) ?  Abdominal pain ?  Lactic acidosis ?  Hypertension ?  Personal history of prostate cancer ?  Anxiety ? ? ?Admitted From: Home ?Disposition:  Home  ? ?Recommendations for Outpatient Follow-up:  ?PCP: in 1 wk ?GI in 1 wk ?Follow up LABS/TEST:  cbc in  1 wk, HIDA scan in 1-2 days and H.Pylori off PPI ? ? ?Diet recommendation: Cardiac diet ? ?Activity: The patient is advised to gradually reintroduce usual activities, as tolerated ? ?Discharge Condition: stable ? ?Code Status: Full code  ? ?History of present illness: As per the H and P dictated on admission ?Hospital Course:  ?Bobby Gamble is a 66 y.o. male with medical history significant for Alcoholic cirrhosis with portal hypertension, grade 2 varices and hypertensive gastropathy on EGD 01/30/2021, history of prostate cancer, who presents to the ED for evaluation of a 2-week history of off-and-on epigastric pain which became acutely worse in the past 24 hours, associated with bloating, radiating to the back, with several episodes of nausea and vomiting, some blood-streaked.  Denies ongoing alcohol use or NSAID use.  Denies fever, chills, dysuria or diarrhea. ?ED course and data review: BP 177/85 with otherwise normal vitals.  WBC 6.3, hemoglobin 14.5.  Lactic acid 5.6-1.9.  AST/ALT 46/44 with alk phos 133 and total bili 1.5.  Lipase 53.  BMP with creatinine 1.19, BUN 24.  Glucose 199 bicarb 19 and potassium 3.4.  EKG, personally viewed and interpreted: Sinus at 68 with nonspecific ST-T wave changes.  Chest x-ray nonacute, CT abdomen and pelvis with contrast showing the following findings ?IMPRESSION: ?1. Cirrhosis.  No focal liver abnormality. ?2. Portal  venous hypertension manifested by progressive gastric and ?esophageal varices and splenomegaly. ?3. Sigmoid diverticulosis without acute diverticulitis. ?4.  Aortic Atherosclerosis (ICD10-I70.0). ?  ?The ED provider spoke with gastroenterologist, Dr. Vicente Males who recommended n.p.o., starting Protonix and octreotide and admission to hospitalist service.  Patient was treated with Dilaudid IV fluid boluses Protonix bolus and infusion as well as octreotide and ceftriaxone.  Hospitalist consulted.  ?Assessment and Plan: ?Hematemesis, History of gastritis, hypertensive gastropathy and grade 2 esophageal varices ?S/p octreotide, Protonix infusion, Rocephin started on admission.  H&H remained stable, patient did not have any hematemesis during hospital stay.  Abdominal pain subsided.  GI consulted, recommended HIDA scan as an outpatient and resume diet if tolerates then discharge home and follow-up as an outpatient.  Continue PPI twice daily and H. pylori test off PPI as an outpatient.  Patient did eat diet, tolerated well, wanted to be discharged and follow-up as an outpatient, agreed with the discharge planning ?Esophageal varices in alcoholic cirrhosis ?EGD on 01/30/2021: Grade II varices were found in the lower third of the esophagus. ?Multiple pedunculated and sessile fundic gland polyps were found in the gastric fundus and ?in the gastric body. Portal hypertensive gastropathy was found in the entire examined stomach ?Increased PPI 40 mg p.o. twice daily, discontinued ibuprofen, follow with GI as an outpatient. ?Lactic acidosis ?Lactic acid improved from 5.6-1.9 with hydration only.  Suspect related to possible GI bleed.  Vital signs remained stable, no signs of infection. ?Abdominal pain, Suspect secondary to gastritis, Pain is well controled now.  Patient tolerated oral diet.  GI recommended HIDA scan as an outpatient. ?Anxiety, resumed  Xanax home dose ?Personal history of prostate cancer, No acute issue suspected at this  time, follow with urology and oncologist as an outpatient. ?Hypertension held losartan during hospital stay, BP remained stable, resumed home medications at discharge.   ? ?Body mass index is 26.63 kg/m?Marland Kitchen  ?Nutrition Interventions: ?   ?Patient was ambulatory without any assistance. ?On the day of the discharge the patient's vitals were stable, and no other acute medical condition were reported by patient. the patient was felt safe to be discharge at Home . ? ?Consultants: GI ?Procedures: None ? ?Discharge Exam: ?General: Appear in no distress, no Rash; Oral Mucosa Clear, moist. ?Cardiovascular: S1 and S2 Present, no Murmur, ?Respiratory: normal respiratory effort, Bilateral Air entry present and no Crackles, no wheezes ?Abdomen: Bowel Sound present, Soft and no tenderness, no hernia ?Extremities: no Pedal edema, no calf tenderness ?Neurology: alert and oriented to time, place, and person ?affect appropriate. ? Danley Danker Weights  ? 12/05/21 1541  ?Weight: 77.1 kg  ? ?Vitals:  ? 12/06/21 0129 12/06/21 0356  ?BP: 136/80 134/88  ?Pulse: 83 84  ?Resp: 16 17  ?Temp: 98.3 ?F (36.8 ?C) 98.5 ?F (36.9 ?C)  ?SpO2: 97% 98%  ? ? ?DISCHARGE MEDICATION: ?Allergies as of 12/06/2021   ? ?   Reactions  ? Atorvastatin Other (See Comments)  ? Joints ache  ? Simvastatin   ? Other reaction(s): muscle aches  ? Codeine Nausea And Vomiting  ? ?  ? ?  ?Medication List  ?  ? ?STOP taking these medications   ? ?allopurinol 300 MG tablet ?Commonly known as: ZYLOPRIM ?  ?doxycycline 100 MG capsule ?Commonly known as: VIBRAMYCIN ?  ?ibuprofen 200 MG tablet ?Commonly known as: ADVIL ?  ? ?  ? ?TAKE these medications   ? ?acyclovir 200 MG capsule ?Commonly known as: ZOVIRAX ?Take 200 mg by mouth every 4 (four) hours as needed. ?  ?ALPRAZolam 0.5 MG tablet ?Commonly known as: Duanne Moron ?Take 0.5 mg by mouth daily as needed. ?  ?Cinnamon 500 MG capsule ?Take 500 mg by mouth in the morning. ?  ?esomeprazole 20 MG capsule ?Commonly known as: Allen ?Take 2  capsules (40 mg total) by mouth 2 (two) times daily before a meal, THEN 1 capsule (20 mg total) 2 (two) times daily before a meal. ?Start taking on: Dec 06, 2021 ?What changed: See the new instructions. ?  ?fexofenadine 60 MG tablet ?Commonly known as: ALLEGRA ?Take 60 mg by mouth daily. ?  ?fexofenadine 180 MG tablet ?Commonly known as: ALLEGRA ?Take 180 mg by mouth daily. ?  ?hydrOXYzine 25 MG tablet ?Commonly known as: ATARAX ?TAKE 1 TO 3 TABS AT BEDTIME AS NEEDED FOR SLEEP ?  ?HYOSCYAMINE PO ?Take 0.125 mg by mouth daily as needed. ?  ?losartan 100 MG tablet ?Commonly known as: COZAAR ?Take 100 mg by mouth daily. ?  ?metoprolol succinate 25 MG 24 hr tablet ?Commonly known as: TOPROL-XL ?Take 25 mg by mouth daily. ?  ?mupirocin ointment 2 % ?Commonly known as: BACTROBAN ?daily as needed. ?  ?Omega 3 1200 MG Caps ?Take 1 capsule by mouth every morning. ?  ?Vitamin D3 50 MCG (2000 UT) Tabs ?Take 2 capsules by mouth every morning. ?  ? ?  ? ?Allergies  ?Allergen Reactions  ? Atorvastatin Other (See Comments)  ?  Joints ache  ? Simvastatin   ?  Other reaction(s): muscle aches  ? Codeine Nausea And Vomiting  ? ?  Discharge Instructions   ? ? Call MD for:   Complete by: As directed ?  ? upper or lower GI bleeding  ? Call MD for:  difficulty breathing, headache or visual disturbances   Complete by: As directed ?  ? Call MD for:  extreme fatigue   Complete by: As directed ?  ? Call MD for:  persistant dizziness or light-headedness   Complete by: As directed ?  ? Call MD for:  persistant nausea and vomiting   Complete by: As directed ?  ? Call MD for:  severe uncontrolled pain   Complete by: As directed ?  ? Diet - low sodium heart healthy   Complete by: As directed ?  ? Discharge instructions   Complete by: As directed ?  ? Follow with PCP in 1 week, HIDA scan in 1 week to rule out acute cholecystitis as per GI recommendation.  Send stool submitted for H. pylori off PPI ?Follow-up with GI in 1 to 2 weeks.  ? Increase  activity slowly   Complete by: As directed ?  ? ?  ? ? ?The results of significant diagnostics from this hospitalization (including imaging, microbiology, ancillary and laboratory) are listed below for referen

## 2021-12-06 NOTE — Consult Note (Signed)
?  ?Bobby Gamble , MD ?[address], [place], [place], [place], [address] ?[address] [place], [place], [place], [address] ?Phone: [phone]  ?Fax: [phone] ? Consultation ? ?Referring Provider:    ER ?Primary Care Physician:  Merrilee Seashore, MD ?Primary Gastroenterologist:  [facility]         ?Reason for Consultation:     Abdominal pain  ? ?Date of Admission:  12/05/2021 ?Date of Consultation:  12/06/2021 ?       ? HPI:   ?Bobby Gamble is a 66 y.o. male patient who was previously followed by [facility] gastroenterology for alcoholic cirrhosis of the liver last seen in March 2023 ? ?Last colonoscopy in 2020 showed adenomatous polyps has had history of hemorrhoids.  He had an upper endoscopy in July 2022 which showed esophageal varices that were not banded and portal hypertensive gastropathy.  In addition had fundic gland polyps.  I do not see any other follow-up notes on epic at this point of time. ? ?He presented to the emergency room with  abdominal pain of > 6 weeks duration , epigastric , on and off , relieved with apple cider vinegar, no other aggrevating factors, non radiating, no nsaid use, brown stools last 2 days back, yesterday had an episode of abdominal pain , put his fingers in his throat to induce vomiting which caused him to have bilious liquid with blood tinge come up, no further episodes. No other complaints. He has been seen by his new GI in 09/2021  ? ? ?In the ER had a CT scan of the abdomen and pelvis with contrast which showed cirrhosis and portal venous hypertension with gastric and esophageal varices and splenomegaly.  No biliary ductal dilation noted. ?Hemoglobin was 13 g.  Elevated lactate which resolved subsequently INR 1.1 lipase of 53 alkaline phosphatase 133 total bilirubin of 1.5 predominantly indirect hyperbilirubinemia.  Creatinine 1.19. ? Repeat hemoglobin this morning is stable at 12.6 g. ? ?Past Medical History:  ?Diagnosis Date  ? Anxiety 06/01/2011  ? tx.  Xanax  ? Arthritis 06/01/2011  ? hx. Gout-tx. Allopurinol  ? Cancer ([place]) 06/01/2011  ? Bx.  with dx. 5 weeks ago-Prostate   ? Cirrhosis ([place])   ? GERD (gastroesophageal reflux disease) 06/01/2011  ? tx. Prilosec  ? History of colon polyps   ? Hyperlipidemia 06/01/2011  ? Hypertension   ? Seasonal allergies   ? Sinus disorder 06/01/2011  ? allergies chronic/ sinus issues  ? ? ?Past Surgical History:  ?Procedure Laterality Date  ? COLONOSCOPY  2020  ? Dr Earlean Shawl   ? MANDIBLE FRACTURE SURGERY  06-01-11  ? Rt. lower jaw-retained hardware.  ? ROBOT ASSISTED LAPAROSCOPIC RADICAL PROSTATECTOMY  06/09/2011  ? Procedure: ROBOTIC ASSISTED LAPAROSCOPIC RADICAL PROSTATECTOMY;  Surgeon: Bernestine Amass, MD;  Location: [facility]S;  Service: Urology;  Laterality: Bilateral;  with Bilateral Lymph Node Dessection  ? [place] EXTRACTION  06-01-11  ? all  ? ? ?Prior to Admission medications   ?Medication Sig Start Date End Date Taking? Authorizing Provider  ?ALPRAZolam (XANAX) 0.5 MG tablet Take 0.5 mg by mouth daily as needed.   Yes [provider]  ?Cinnamon 500 MG capsule Take 500 mg by mouth in the morning.   Yes [provider]  ?acyclovir (ZOVIRAX) 200 MG capsule Take 200 mg by mouth every 4 (four) hours as needed.    [provider]  ?allopurinol (ZYLOPRIM) 300 MG tablet Take 300 mg by mouth every morning. ?Patient  not taking: No sig reported    [provider]  ?Cholecalciferol (VITAMIN D3) 2000 UNITS TABS Take 2 capsules by mouth every morning.    [provider]  ?doxycycline (VIBRAMYCIN) 100 MG capsule daily as needed. 08/09/19   [provider]  ?esomeprazole (NEXIUM) 20 MG capsule Take 20 mg by mouth daily at 12 noon.    [provider]  ?fexofenadine (ALLEGRA ALLERGY) 180 MG tablet Take 180 mg by mouth daily.    [provider]  ?fexofenadine (ALLEGRA) 60 MG tablet Take 60 mg by mouth daily.    [provider]  ?hydrOXYzine (ATARAX/VISTARIL) 25  MG tablet TAKE 1 TO 3 TABS AT BEDTIME AS NEEDED FOR SLEEP 08/08/19   [provider]  ?Hyoscyamine Sulfate (HYOSCYAMINE PO) Take 0.125 mg by mouth daily as needed.    [provider]  ?losartan (COZAAR) 100 MG tablet Take 100 mg by mouth daily. 04/13/20   [provider]  ?metoprolol succinate (TOPROL-XL) 25 MG 24 hr tablet Take 25 mg by mouth daily. 11/23/21   [provider]  ?mupirocin ointment (BACTROBAN) 2 % daily as needed.    [provider]  ?OMEGA 3 1200 MG CAPS Take 1 capsule by mouth every morning.    [provider]  ? ? ?Family History  ?Problem Relation Age of Onset  ? Liver disease Father   ?     unsure of what type  ? Esophageal cancer Neg Hx   ? Colon cancer Neg Hx   ? Pancreatic cancer Neg Hx   ? Pancreatic disease Neg Hx   ? Colon polyps Neg Hx   ? Prostate cancer Neg Hx   ? Rectal cancer Neg Hx   ? Stomach cancer Neg Hx   ?  ? ?Social History  ? ?Tobacco Use  ? Smoking status: Former  ? Smokeless tobacco: Never  ?Vaping Use  ? Vaping Use: Never used  ?Substance Use Topics  ? Alcohol use: Not Currently  ?  Alcohol/week: 4.0 standard drinks  ?  Types: 4 Shots of liquor per week  ? Drug use: No  ? ? ?Allergies as of 12/05/2021 - Review Complete 12/05/2021  ?Allergen Reaction Noted  ? Atorvastatin Other (See Comments) 09/12/2020  ? Simvastatin  09/12/2020  ? Codeine Nausea And Vomiting 06/01/2011  ? ? ?Review of Systems:    ?All systems reviewed and negative except where noted in HPI. ? ? Physical Exam:  ?Vital signs in last 24 hours: ?Temp:  [98.2 ?F (36.8 ?C)-98.5 ?F (36.9 ?C)] 98.5 ?F (36.9 ?C) (05/14 0356) ?Pulse Rate:  [62-91] 84 (05/14 0356) ?Resp:  [13-22] 17 (05/14 0356) ?BP: (127-177)/(80-93) 134/88 (05/14 0356) ?SpO2:  [93 %-100 %] 98 % (05/14 0356) ?Weight:  [77.1 kg] 77.1 kg (05/13 1541) ?  ?General:   Pleasant, cooperative in NAD ?Head:  Normocephalic and atraumatic. ?Eyes:   No icterus.   Conjunctiva pink. PERRLA. ?Ears:  Normal auditory  acuity. ?Neck:  Supple; no masses or thyroidomegaly ?Lungs: Respirations even and unlabored. Lungs clear to auscultation bilaterally.   No wheezes, crackles, or rhonchi.  ?Heart:  Regular rate and rhythm;  Without murmur, clicks, rubs or gallops ?Abdomen:  Soft, nondistended, nontender. Normal bowel sounds. No appreciable masses or hepatomegaly.  No rebound or guarding.  ?Neurologic:  Alert and oriented x3;  grossly normal neurologically. ?Skin:  Intact without significant lesions or rashes. ?Cervical Nodes:  No significant cervical adenopathy. ?Psych:  Alert and cooperative. Normal affect. ? ?LAB RESULTS: ?  Recent Labs  ?  12/05/21 ?1550 12/05/21 ?2347 12/06/21 ?8938 12/06/21 ?1133  ?WBC 6.3  --   --  4.5  ?HGB 14.5 13.0 12.6* 12.3*  ?HCT 40.3  --   --  34.1*  ?PLT 103*  --   --  66*  ? ?BMET ?Recent Labs  ?  12/05/21 ?1550 12/06/21 ?1133  ?NA 138 138  ?K 3.4* 4.0  ?CL 105 109  ?CO2 19* 24  ?GLUCOSE 199* 133*  ?BUN 24* 18  ?CREATININE 1.19 1.05  ?CALCIUM 9.8 8.4*  ? ?LFT ?Recent Labs  ?  12/05/21 ?1551  ?PROT 7.3  ?ALBUMIN 3.8  ?AST 46*  ?ALT 44  ?ALKPHOS 133*  ?BILITOT 1.5*  ?BILIDIR 0.3*  ?IBILI 1.2*  ? ?PT/INR ?Recent Labs  ?  12/05/21 ?1618  ?LABPROT 14.5  ?INR 1.1  ? ? ?STUDIES: ?DG Chest 2 View ? ?Result Date: 12/05/2021 ?CLINICAL DATA:  Epigastric pain radiating to chest, nausea and vomiting intermittently for 1 month EXAM: CHEST - 2 VIEW COMPARISON:  03/08/2019 FINDINGS: Frontal and lateral views of the chest demonstrate an unremarkable cardiac silhouette. No acute airspace disease, effusion, or pneumothorax. There are no acute bony abnormalities. IMPRESSION: 1. No acute intrathoracic process. Electronically Signed   By: Randa Ngo M.D.   On: 12/05/2021 16:17  ? ?CT ABDOMEN PELVIS W CONTRAST ? ?Result Date: 12/05/2021 ?CLINICAL DATA:  Acute abdominal pain EXAM: CT ABDOMEN AND PELVIS WITH CONTRAST TECHNIQUE: Multidetector CT imaging of the abdomen and pelvis was performed using the standard protocol  following bolus administration of intravenous contrast. RADIATION DOSE REDUCTION: This exam was performed according to the departmental dose-optimization program which includes automated exposure control, a

## 2021-12-10 ENCOUNTER — Other Ambulatory Visit: Payer: Self-pay | Admitting: Gastroenterology

## 2021-12-25 ENCOUNTER — Encounter (HOSPITAL_COMMUNITY): Payer: Self-pay | Admitting: Gastroenterology

## 2022-01-04 NOTE — H&P (Signed)
History of Present Illness  General:          66 year old male        alcohol related cirrhosis(negative hepatitis B and hepatitis C serology, negative worker for primary biliary cholangitis and automate hepatitis, unremarkable alpha-1 antitrypsin)        history of esophageal varices, EGD 7/22: the official varices, grade 2, portal hypertensive gastropathy, recommended to start beta blockers        MRI 5/22: cirrhosis, portal hypertension, varices        colonoscopy, Dr. Earlean Shawl, 05/2019: 6 polyps removed, AVM and rectum, large internal hemorrhoids        last office visit 09/25/21- reported drinking 2 to 3 drinks at a time on Friday and Saturday        labs from 09/25/21: PT 12.1, INR 1.2, alpha-fetoprotein normal 6.6, sodium 141, potassium 4.3, BUN 18, creatinine 1.09, GFR more than 60, TB/AST/ALT/ALP of 1.1/34/39/120, MELD Na was 10        hemoglobin 14.3, platelet 93        admitted from 12/05/21 to 12/06/21 with acute epigastric abdominal pain, CT showed cirrhosis, portal hypertension, progressive gastric and esophageal varices, splenomegaly, sigmoid diverticulosis        was recommended to have HIDA as an outpatient, continue PPI twice a day        labs from 12/06/21:sodium 138, potassium for, BUN 18, creatinine 1.05, GFR more than 60, TB/AST/ALT/ALP of 1.5/46/44/133, hemoglobin 12.3, platelet 66, MCV 93 point, PT 14.5, INR 1.1        alpha-fetoprotein 7.4 from 5/22        He c/o "bad bubble of gas", that moved up and felt that it was radiating to his chest and was associated with chills and he felt clammy. Pain improved after 2 shots of dilaudid, he has not have similar pain since and has had 2 similar episodes 2-3 months ago.        He is on nexium 40 mg twice a day since discharge from ER and was on 20 mg once a day before.        Denies blood in stool or black stools.        He was sticking finger in his mouth and wanted to get gas from her abdomen.        He is on a low dose metoprolol,  dosage unknown, as he was tired all the time.     Current Medications  Taking  NexIUM(Esomeprazole Magnesium) 40 MG Packet 1 capsule Orally Once a day, Notes: twice a day    Allegra Allergy(Fexofenadine HCl) 180 MG Tablet 1 tablet Orally every morning    Cinnamon 500 MG Capsule as directed Orally every morning    Allopurinol 300 MG Tablet 1 tablet Orally Once a day    Vitamin D3 125 MCG (5000 UT) Tablet as directed Orally     Fish Oil 1000 MG Capsule 1 capsule Orally Once a day    Losartan Potassium 100 MG Tablet 1 tablet Orally Once a day    Doxycycline 100 mg once daily PO as needed    Mupirocin 2 % Ointment 1 application Externally as needed    Acyclovir 400 mg tablet one tab orally as needed    Hyoscyamine Sulfate 0.125 MG Tablet 1 tablet as needed Orally every 4 hrs    hydrOXYzine HCl 25 MG Tablet 1 tablet at bedtime as needed Orally Once a day    ALPRAZolam 0.5 MG Tablet 1  tablet Orally as needed    Medication List reviewed and reconciled with the patient         Past Medical History        Alcoholic Cirrhosis of liver without ascites.         Secondary Esophageal Varices without bleeding.         Prediabetes.         Colon polyps.         Internal hemorrhoids.         T2DM.         HTN.         Insomnia.         Prostate Cancer.         Hyperlipidemia.         Chronic Renal Failure.         Hypothyroidism.         Gout.        Surgical History         Colonoscopy 06/12/2019          Endoscopy 06/12/2019          prostatectomy        Family History   Father: liver dz   Mother: diagnosed with Diabetes   No family history of colon cancer or polyps.       Social History  General:   Tobacco use       cigarettes:  Former smoker     Tobacco history last updated  12/10/2021 Alcohol: occasionally.  no Recreational drug use.      Allergies   Codeine: weakness - Allergy   Atorvastatin: weakness -  Allergy   Hydrocodone: nausea - Allergy       Hospitalization/Major Diagnostic Procedure   ABD pain 11/2021       Review of Systems  GI PROCEDURE:          Pacemaker/ AICD no.  Artificial heart valves no.  MI/heart attack no.  Abnormal heart rhythm no.  Angina no.  CVA no.  Hypertension  YES.  Hypotension no.  Asthma, COPD no.  Sleep apnea no.  Seizure disorders no.  Artificial joints no.  Diabetes no.  Significant headaches no.  Vertigo no.  Depression/anxiety no.  Abnormal bleeding no.  Kidney Disease no.  Liver disease YES.  Blood transfusion no.       Vital Signs  Wt 172.6, Wt change -4.6 lbs, Temp 97.9, Pulse sitting 70, BP sitting 127/83.     Examination  Gastroenterology::        GENERAL APPEARANCE: Well developed, well nourished, no active distress, pleasant.         SCLERA: anicteric.         CARDIOVASCULAR Normal RRR .         RESPIRATORY Breath sounds normal. Respiration even and unlabored.         ABDOMEN No masses palpated. Liver and spleen not palpated, normal. Bowel sounds normal, Abdomen not distended.         EXTREMITIES: No edema.         NEURO: alert, oriented to time, place and person, normal gait.         PSYCH: mood/affect normal.       Assessments     1. Alcoholic cirrhosis of liver without ascites - K70.30   2. Esophageal varices determined by endoscopy - I85.00   3. Epigastric pain - R10.13     Treatment   1. Alcoholic cirrhosis of liver  without ascites         IMAGING: Endoscopy              Weston Brass 12/10/2021 03:58:38 PM > Pt scheduled on 6/13 at [facility] EGD with Banding, gave procedure instructions. Spoke to [place]   Notes: advised patient to completely stop alcohol use. Plan EGD at [facility] for possible banding of esophageal varices. If gastric varices and noted, patient will need IR evaluation for BRTO or TIPS.       2. Esophageal varices determined by endoscopy         IMAGING: Endoscopy              Weston Brass 12/10/2021  03:58:38 PM > Pt scheduled on 6/13 at [facility] EGD with Banding, gave procedure instructions. Spoke to [place]     3. Epigastric pain   Notes: Continue nexium 40 mg twice a day for now.

## 2022-01-05 ENCOUNTER — Encounter (HOSPITAL_COMMUNITY): Payer: Self-pay | Admitting: Gastroenterology

## 2022-01-05 ENCOUNTER — Ambulatory Visit (HOSPITAL_COMMUNITY)
Admission: RE | Admit: 2022-01-05 | Discharge: 2022-01-05 | Disposition: A | Discharge status: Home / Self Care | Payer: Self-pay | Attending: Gastroenterology | Admitting: Gastroenterology

## 2022-01-05 ENCOUNTER — Encounter (HOSPITAL_COMMUNITY)
Admission: RE | Disposition: A | Discharge status: Home / Self Care | Payer: Self-pay | Source: Home / Self Care | Attending: Gastroenterology

## 2022-01-05 ENCOUNTER — Other Ambulatory Visit: Payer: Self-pay

## 2022-01-05 ENCOUNTER — Ambulatory Visit (HOSPITAL_BASED_OUTPATIENT_CLINIC_OR_DEPARTMENT_OTHER): Payer: Self-pay | Admitting: Certified Registered"

## 2022-01-05 ENCOUNTER — Ambulatory Visit (HOSPITAL_COMMUNITY): Payer: Self-pay | Admitting: Certified Registered"

## 2022-01-05 DIAGNOSIS — K7030 Alcoholic cirrhosis of liver without ascites: Secondary | ICD-10-CM | POA: Insufficient documentation

## 2022-01-05 DIAGNOSIS — K317 Polyp of stomach and duodenum: Secondary | ICD-10-CM

## 2022-01-05 DIAGNOSIS — K3189 Other diseases of stomach and duodenum: Secondary | ICD-10-CM

## 2022-01-05 DIAGNOSIS — K766 Portal hypertension: Secondary | ICD-10-CM | POA: Insufficient documentation

## 2022-01-05 DIAGNOSIS — Z87891 Personal history of nicotine dependence: Secondary | ICD-10-CM

## 2022-01-05 DIAGNOSIS — I10 Essential (primary) hypertension: Secondary | ICD-10-CM | POA: Insufficient documentation

## 2022-01-05 DIAGNOSIS — I8500 Esophageal varices without bleeding: Secondary | ICD-10-CM

## 2022-01-05 DIAGNOSIS — Z79899 Other long term (current) drug therapy: Secondary | ICD-10-CM | POA: Insufficient documentation

## 2022-01-05 DIAGNOSIS — I8510 Secondary esophageal varices without bleeding: Secondary | ICD-10-CM | POA: Insufficient documentation

## 2022-01-05 HISTORY — PX: BIOPSY: SHX5522

## 2022-01-05 HISTORY — PX: ESOPHAGOGASTRODUODENOSCOPY (EGD) WITH PROPOFOL: SHX5813

## 2022-01-05 LAB — GLUCOSE, CAPILLARY: Glucose-Capillary: 122 mg/dL — ABNORMAL HIGH (ref 70–99)

## 2022-01-05 SURGERY — ESOPHAGOGASTRODUODENOSCOPY (EGD) WITH PROPOFOL
Anesthesia: Monitor Anesthesia Care

## 2022-01-05 MED ORDER — PROPOFOL 10 MG/ML IV BOLUS
INTRAVENOUS | Status: DC | PRN
  Administered 2022-01-05: 20 mg via INTRAVENOUS
  Administered 2022-01-05: 10 mg via INTRAVENOUS
  Administered 2022-01-05: 20 mg via INTRAVENOUS
  Administered 2022-01-05: 30 mg via INTRAVENOUS

## 2022-01-05 MED ORDER — PROPOFOL 500 MG/50ML IV EMUL
INTRAVENOUS | Status: DC | PRN
  Administered 2022-01-05: 125 ug/kg/min via INTRAVENOUS

## 2022-01-05 MED ORDER — LIDOCAINE 2% (20 MG/ML) 5 ML SYRINGE
INTRAMUSCULAR | Status: DC | PRN
  Administered 2022-01-05: 50 mg via INTRAVENOUS

## 2022-01-05 MED ORDER — SODIUM CHLORIDE 0.9 % IV SOLN: INTRAVENOUS | Status: DC

## 2022-01-05 MED ORDER — LACTATED RINGERS IV SOLN
INTRAVENOUS | Status: DC
  Administered 2022-01-05: 1000 mL via INTRAVENOUS

## 2022-01-05 SURGICAL SUPPLY — 15 items

## 2022-01-05 NOTE — Anesthesia Postprocedure Evaluation (Signed)
Anesthesia Post Note  Patient: Bobby Gamble  Procedure(s) Performed: ESOPHAGOGASTRODUODENOSCOPY (EGD) WITH PROPOFOL BIOPSY     Patient location during evaluation: PACU Anesthesia Type: MAC Level of consciousness: awake and alert Pain management: pain level controlled Vital Signs Assessment: post-procedure vital signs reviewed and stable Respiratory status: spontaneous breathing, nonlabored ventilation, respiratory function stable and patient connected to nasal cannula oxygen Cardiovascular status: stable and blood pressure returned to baseline Postop Assessment: no apparent nausea or vomiting Anesthetic complications: no   No notable events documented.  Last Vitals:  Vitals:   01/05/22 1130 01/05/22 1131  BP: 114/81 118/80  Pulse: 65 66  Resp: (!) 22 13  Temp:  37.1 C  SpO2: 96% 96%    Last Pain:  Vitals:   01/05/22 1131  TempSrc: Temporal  PainSc: 0-No pain                 Floy Riegler S

## 2022-01-05 NOTE — Transfer of Care (Signed)
Immediate Anesthesia Transfer of Care Note  Patient: Bobby Gamble  Procedure(s) Performed: ESOPHAGOGASTRODUODENOSCOPY (EGD) WITH PROPOFOL BIOPSY  Patient Location: PACU and Endoscopy Unit  Anesthesia Type:MAC  Level of Consciousness: oriented, drowsy and patient cooperative  Airway & Oxygen Therapy: Patient Spontanous Breathing and Patient connected to face mask oxygen  Post-op Assessment: Report given to RN and Post -op Vital signs reviewed and stable  Post vital signs: Reviewed and stable  Last Vitals:  Vitals Value Taken Time  BP    Temp    Pulse 62 01/05/22 1122  Resp 24 01/05/22 1122  SpO2 100 % 01/05/22 1122  Vitals shown include unvalidated device data.  Last Pain:  Vitals:   01/05/22 1021  TempSrc: Tympanic  PainSc: 0-No pain         Complications: No notable events documented.

## 2022-01-05 NOTE — Anesthesia Procedure Notes (Signed)
Procedure Name: MAC Date/Time: 01/05/2022 11:05 AM  Performed by: Eben Burow, CRNAPre-anesthesia Checklist: Patient identified, Emergency Drugs available, Suction available, Patient being monitored and Timeout performed Oxygen Delivery Method: Simple face mask Placement Confirmation: positive ETCO2

## 2022-01-05 NOTE — Interval H&P Note (Signed)
History and Physical Interval Note: 65/male with cirrhosis, history of grade 2 esophageal varices, last EGD 7/22,epigastric pain for EGD with possible banding with propofol.  01/05/2022 10:23 AM  Bobby Gamble  has presented today for EGD with possible banding, with the diagnosis of Alcoholic cirrhosis, esophageal varices.  The various methods of treatment have been discussed with the patient and family. After consideration of risks, benefits and other options for treatment, the patient has consented to  Procedure(s): ESOPHAGOGASTRODUODENOSCOPY (EGD) WITH PROPOFOL (N/A) GASTRIC VARICES BANDING (N/A) as a surgical intervention.  The patient's history has been reviewed, patient examined, no change in status, stable for surgery.  I have reviewed the patient's chart and labs.  Questions were answered to the patient's satisfaction.     Ronnette Juniper

## 2022-01-05 NOTE — Anesthesia Preprocedure Evaluation (Signed)
Anesthesia Evaluation  Patient identified by MRN, date of birth, ID band Patient awake    Reviewed: Allergy & Precautions, NPO status , Patient's Chart, lab work & pertinent test results  Airway Mallampati: II  TM Distance: >3 FB Neck ROM: Full    Dental no notable dental hx.    Pulmonary neg pulmonary ROS, former smoker,    Pulmonary exam normal breath sounds clear to auscultation       Cardiovascular hypertension, Normal cardiovascular exam Rhythm:Regular Rate:Normal     Neuro/Psych negative neurological ROS  negative psych ROS   GI/Hepatic negative GI ROS, (+) Cirrhosis   Esophageal Varices    , Portal HTN   Endo/Other  negative endocrine ROS  Renal/GU negative Renal ROS  negative genitourinary   Musculoskeletal negative musculoskeletal ROS (+)   Abdominal   Peds negative pediatric ROS (+)  Hematology negative hematology ROS (+)   Anesthesia Other Findings   Reproductive/Obstetrics negative OB ROS                             Anesthesia Physical Anesthesia Plan  ASA: 3  Anesthesia Plan: MAC   Post-op Pain Management: Minimal or no pain anticipated   Induction: Intravenous  PONV Risk Score and Plan: 1 and Propofol infusion and Treatment may vary due to age or medical condition  Airway Management Planned: Simple Face Mask  Additional Equipment:   Intra-op Plan:   Post-operative Plan:   Informed Consent: I have reviewed the patients History and Physical, chart, labs and discussed the procedure including the risks, benefits and alternatives for the proposed anesthesia with the patient or authorized representative who has indicated his/her understanding and acceptance.     Dental advisory given  Plan Discussed with: CRNA and Surgeon  Anesthesia Plan Comments:         Anesthesia Quick Evaluation

## 2022-01-05 NOTE — Op Note (Signed)
[facility] Patient Name: Bobby Gamble Procedure Date: 01/05/2022 MRN: [id number] Attending MD: Ronnette Juniper , MD Date of Birth: [birth year] CSN: [id number] Age: 66 Admit Type: Outpatient Procedure:                Upper GI endoscopy Indications:              Epigastric abdominal pain, Follow-up of esophageal                            varices Providers:                Ronnette Juniper, MD, Jaci Carrel, RN, William Dalton, Technician, Darliss Cheney, Technician Referring MD:             Georgiana Shore Medicines:                Monitored Anesthesia Care Complications:            No immediate complications. Estimated Blood Loss:     Estimated blood loss: none. Estimated blood loss                            was minimal. Procedure:                Pre-Anesthesia Assessment:                           - Prior to the procedure, a History and Physical                            was performed, and patient medications and                            allergies were reviewed. The patient's tolerance of                            previous anesthesia was also reviewed. The risks                            and benefits of the procedure and the sedation                            options and risks were discussed with the patient.                            All questions were answered, and informed consent                            was obtained. Prior Anticoagulants: The patient has                            taken no previous anticoagulant or antiplatelet                            agents. ASA Grade  Assessment: III - A patient with                            severe systemic disease. After reviewing the risks                            and benefits, the patient was deemed in                            satisfactory condition to undergo the procedure.                           After obtaining informed consent, the endoscope was                             passed under direct vision. Throughout the                            procedure, the patient's blood pressure, pulse, and                            oxygen saturations were monitored continuously. The                            [id number]-H190 ([id number]) Olympus endoscope was introduced                            through the mouth, and advanced to the second part                            of duodenum. The upper GI endoscopy was                            accomplished without difficulty. The patient                            tolerated the procedure well. Scope In: Scope Out: Findings:      The upper third of the esophagus and middle third of the esophagus were       normal.      Grade I varices were found in the lower third of the esophagus. They       were diminutive in size.      The Z-line was regular and was found 35 cm from the incisors.      Multiple medium pedunculated and sessile polyps with no bleeding and no       stigmata of recent bleeding were found in the cardia, in the gastric       fundus and in the gastric body.      Localized moderately erythematous mucosa without bleeding was found in       the gastric antrum. Biopsies were taken with a cold forceps for       Helicobacter pylori testing.      The cardia and gastric fundus were normal on retroflexion. There was no       evidence of gastric varices.  The examined duodenum was normal.      Mild portal hypertensive gastropathy was found in the cardia, in the       gastric fundus and in the gastric body. Impression:               - Normal upper third of esophagus and middle third                            of esophagus.                           - Grade I esophageal varices.                           - Z-line regular, 35 cm from the incisors.                           - Multiple gastric polyps.                           - Erythematous mucosa in the antrum. Biopsied.                           - Normal examined  duodenum. Moderate Sedation:      Patient did not receive moderate sedation for this procedure, but       instead received monitored anesthesia care. Recommendation:           - Patient has a contact number available for                            emergencies. The signs and symptoms of potential                            delayed complications were discussed with the                            patient. Return to normal activities tomorrow.                            Written discharge instructions were provided to the                            patient.                           - Resume regular diet.                           - Continue present medications. Continue beta                            blocker.                           - Await pathology results.                           -  Repeat upper endoscopy in 3 years for                            surveillance. Procedure Code(s):        --- Professional ---                           [phone], Esophagogastroduodenoscopy, flexible,                            transoral; with biopsy, single or multiple Diagnosis Code(s):        --- Professional ---                           I85.00, Esophageal varices without bleeding                           K31.7, Polyp of stomach and duodenum                           K31.89, Other diseases of stomach and duodenum                           R10.13, Epigastric pain CPT copyright 2019 [employer]. All rights reserved. The codes documented in this report are preliminary and upon coder review may  be revised to meet current compliance requirements. Ronnette Juniper, MD 01/05/2022 11:20:40 AM This report has been signed electronically. Number of Addenda: 0

## 2022-01-05 NOTE — Discharge Instructions (Signed)

## 2022-01-06 LAB — SURGICAL PATHOLOGY

## 2022-01-10 ENCOUNTER — Encounter (HOSPITAL_COMMUNITY): Payer: Self-pay | Admitting: Gastroenterology

## 2022-01-14 ENCOUNTER — Encounter (HOSPITAL_COMMUNITY): Payer: Self-pay | Admitting: Emergency Medicine

## 2022-01-14 ENCOUNTER — Inpatient Hospital Stay (HOSPITAL_COMMUNITY)
Admission: EM | Admit: 2022-01-14 | Discharge: 2022-01-16 | DRG: 444 | Disposition: A | Discharge status: Home / Self Care | Payer: Self-pay | Attending: Internal Medicine | Admitting: Internal Medicine

## 2022-01-14 ENCOUNTER — Emergency Department (HOSPITAL_COMMUNITY): Payer: Self-pay

## 2022-01-14 DIAGNOSIS — F419 Anxiety disorder, unspecified: Secondary | ICD-10-CM | POA: Diagnosis present

## 2022-01-14 DIAGNOSIS — E1165 Type 2 diabetes mellitus with hyperglycemia: Secondary | ICD-10-CM | POA: Diagnosis present

## 2022-01-14 DIAGNOSIS — D696 Thrombocytopenia, unspecified: Secondary | ICD-10-CM | POA: Diagnosis present

## 2022-01-14 DIAGNOSIS — I8510 Secondary esophageal varices without bleeding: Secondary | ICD-10-CM | POA: Diagnosis present

## 2022-01-14 DIAGNOSIS — K810 Acute cholecystitis: Principal | ICD-10-CM | POA: Diagnosis present

## 2022-01-14 DIAGNOSIS — K819 Cholecystitis, unspecified: Principal | ICD-10-CM

## 2022-01-14 DIAGNOSIS — Z87891 Personal history of nicotine dependence: Secondary | ICD-10-CM

## 2022-01-14 DIAGNOSIS — Z79899 Other long term (current) drug therapy: Secondary | ICD-10-CM

## 2022-01-14 DIAGNOSIS — Z20822 Contact with and (suspected) exposure to covid-19: Secondary | ICD-10-CM | POA: Diagnosis present

## 2022-01-14 DIAGNOSIS — K820 Obstruction of gallbladder: Secondary | ICD-10-CM | POA: Diagnosis present

## 2022-01-14 DIAGNOSIS — F1011 Alcohol abuse, in remission: Secondary | ICD-10-CM | POA: Diagnosis present

## 2022-01-14 DIAGNOSIS — I10 Essential (primary) hypertension: Secondary | ICD-10-CM | POA: Diagnosis present

## 2022-01-14 DIAGNOSIS — K831 Obstruction of bile duct: Secondary | ICD-10-CM | POA: Diagnosis present

## 2022-01-14 DIAGNOSIS — K219 Gastro-esophageal reflux disease without esophagitis: Secondary | ICD-10-CM | POA: Diagnosis present

## 2022-01-14 DIAGNOSIS — Z8546 Personal history of malignant neoplasm of prostate: Secondary | ICD-10-CM

## 2022-01-14 DIAGNOSIS — Z9079 Acquired absence of other genital organ(s): Secondary | ICD-10-CM

## 2022-01-14 DIAGNOSIS — K7030 Alcoholic cirrhosis of liver without ascites: Secondary | ICD-10-CM | POA: Diagnosis present

## 2022-01-14 DIAGNOSIS — J302 Other seasonal allergic rhinitis: Secondary | ICD-10-CM | POA: Diagnosis present

## 2022-01-14 LAB — COMPREHENSIVE METABOLIC PANEL
ALT: 39 U/L (ref 0–44)
AST: 44 U/L — ABNORMAL HIGH (ref 15–41)
Albumin: 3.8 g/dL (ref 3.5–5.0)
Alkaline Phosphatase: 139 U/L — ABNORMAL HIGH (ref 38–126)
Anion gap: 12 (ref 5–15)
BUN: 16 mg/dL (ref 8–23)
CO2: 19 mmol/L — ABNORMAL LOW (ref 22–32)
Calcium: 9.5 mg/dL (ref 8.9–10.3)
Chloride: 105 mmol/L (ref 98–111)
Creatinine, Ser: 1.07 mg/dL (ref 0.61–1.24)
GFR, Estimated: >60 mL/min (ref >60)
Glucose, Bld: 269 mg/dL — ABNORMAL HIGH (ref 70–99)
Potassium: 3.9 mmol/L (ref 3.5–5.1)
Sodium: 136 mmol/L (ref 135–145)
Total Bilirubin: 1.3 mg/dL — ABNORMAL HIGH (ref 0.3–1.2)
Total Protein: 7.6 g/dL (ref 6.5–8.1)

## 2022-01-14 LAB — CBC
HCT: 39.2 % (ref 39.0–52.0)
Hemoglobin: 14.4 g/dL (ref 13.0–17.0)
MCH: 34.4 pg — ABNORMAL HIGH (ref 26.0–34.0)
MCHC: 36.7 g/dL — ABNORMAL HIGH (ref 30.0–36.0)
MCV: 93.6 fL (ref 80.0–100.0)
Platelets: 92 10*3/uL — ABNORMAL LOW (ref 150–400)
RBC: 4.19 MIL/uL — ABNORMAL LOW (ref 4.22–5.81)
RDW: 13.4 % (ref 11.5–15.5)
WBC: 4.9 10*3/uL (ref 4.0–10.5)
nRBC: 0 % (ref 0.0–0.2)

## 2022-01-14 LAB — URINALYSIS, ROUTINE W REFLEX MICROSCOPIC
Bilirubin Urine: NEGATIVE
Glucose, UA: 150 mg/dL — AB
Hgb urine dipstick: NEGATIVE
Ketones, ur: NEGATIVE mg/dL
Leukocytes,Ua: NEGATIVE
Nitrite: NEGATIVE
Protein, ur: NEGATIVE mg/dL
Specific Gravity, Urine: 1.015 (ref 1.005–1.030)
pH: 9 — ABNORMAL HIGH (ref 5.0–8.0)

## 2022-01-14 LAB — SARS CORONAVIRUS 2 BY RT PCR: SARS Coronavirus 2 by RT PCR: NEGATIVE

## 2022-01-14 LAB — TROPONIN I (HIGH SENSITIVITY)
Troponin I (High Sensitivity): 4 ng/L (ref <18)
Troponin I (High Sensitivity): 5 ng/L (ref <18)

## 2022-01-14 LAB — PROTIME-INR
INR: 1.2 (ref 0.8–1.2)
Prothrombin Time: 15.5 seconds — ABNORMAL HIGH (ref 11.4–15.2)

## 2022-01-14 LAB — HEMOGLOBIN A1C
Hgb A1c MFr Bld: 6.8 % — ABNORMAL HIGH (ref 4.8–5.6)
Mean Plasma Glucose: 148.46 mg/dL

## 2022-01-14 LAB — LIPASE, BLOOD: Lipase: 41 U/L (ref 11–51)

## 2022-01-14 MED ORDER — KETOROLAC TROMETHAMINE 30 MG/ML IJ SOLN
30.0000 mg | Freq: Once | INTRAMUSCULAR | Status: AC
  Administered 2022-01-14: 30 mg via INTRAVENOUS
  Filled 2022-01-14: qty 1

## 2022-01-14 MED ORDER — SODIUM CHLORIDE 0.9 % IV SOLN: INTRAVENOUS | Status: DC

## 2022-01-14 MED ORDER — ONDANSETRON HCL 4 MG/2ML IJ SOLN
4.0000 mg | Freq: Four times a day (QID) | INTRAMUSCULAR | Status: DC | PRN
  Administered 2022-01-15 (×2): 4 mg via INTRAVENOUS
  Filled 2022-01-14 (×2): qty 2

## 2022-01-14 MED ORDER — ACETAMINOPHEN 650 MG RE SUPP: 650.0000 mg | Freq: Four times a day (QID) | RECTAL | Status: DC | PRN

## 2022-01-14 MED ORDER — MORPHINE SULFATE (PF) 2 MG/ML IV SOLN
INTRAVENOUS | Status: AC
  Filled 2022-01-14: qty 2

## 2022-01-14 MED ORDER — ALPRAZOLAM 0.5 MG PO TABS
0.5000 mg | ORAL_TABLET | Freq: Every day | ORAL | Status: DC | PRN
Start: 2022-01-14 — End: 2022-01-16
  Administered 2022-01-15: 0.5 mg via ORAL
  Filled 2022-01-14: qty 1

## 2022-01-14 MED ORDER — PANTOPRAZOLE SODIUM 40 MG IV SOLR
40.0000 mg | Freq: Two times a day (BID) | INTRAVENOUS | Status: DC
  Administered 2022-01-14 – 2022-01-16 (×4): 40 mg via INTRAVENOUS
  Filled 2022-01-14 (×4): qty 10

## 2022-01-14 MED ORDER — OXYCODONE-ACETAMINOPHEN 5-325 MG PO TABS
1.0000 | ORAL_TABLET | Freq: Once | ORAL | Status: DC
  Filled 2022-01-14: qty 1

## 2022-01-14 MED ORDER — ONDANSETRON 8 MG PO TBDP
8.0000 mg | ORAL_TABLET | Freq: Once | ORAL | Status: DC
  Filled 2022-01-14: qty 1

## 2022-01-14 MED ORDER — HYDROMORPHONE HCL 1 MG/ML IJ SOLN
1.0000 mg | Freq: Once | INTRAMUSCULAR | Status: AC
  Administered 2022-01-14: 1 mg via INTRAVENOUS
  Filled 2022-01-14: qty 1

## 2022-01-14 MED ORDER — MORPHINE SULFATE (PF) 4 MG/ML IV SOLN
3.0000 mg | Freq: Once | INTRAVENOUS | Status: AC
  Administered 2022-01-14: 3 mg via INTRAVENOUS

## 2022-01-14 MED ORDER — HYDRALAZINE HCL 20 MG/ML IJ SOLN: 10.0000 mg | Freq: Three times a day (TID) | INTRAMUSCULAR | Status: DC | PRN

## 2022-01-14 MED ORDER — CEFTRIAXONE SODIUM 2 G IJ SOLR
2.0000 g | INTRAMUSCULAR | Status: DC
  Administered 2022-01-15: 2 g via INTRAVENOUS
  Filled 2022-01-14: qty 20

## 2022-01-14 MED ORDER — ONDANSETRON HCL 4 MG PO TABS: 4.0000 mg | ORAL_TABLET | Freq: Four times a day (QID) | ORAL | Status: DC | PRN

## 2022-01-14 MED ORDER — OXYCODONE HCL 5 MG PO TABS: 5.0000 mg | ORAL_TABLET | ORAL | Status: DC | PRN

## 2022-01-14 MED ORDER — ACETAMINOPHEN 325 MG PO TABS
650.0000 mg | ORAL_TABLET | Freq: Four times a day (QID) | ORAL | Status: DC | PRN
  Administered 2022-01-15 (×2): 650 mg via ORAL
  Filled 2022-01-14 (×2): qty 2

## 2022-01-14 MED ORDER — ONDANSETRON HCL 4 MG/2ML IJ SOLN
4.0000 mg | Freq: Once | INTRAMUSCULAR | Status: AC
  Administered 2022-01-14: 4 mg via INTRAVENOUS
  Filled 2022-01-14: qty 2

## 2022-01-14 MED ORDER — CEFTRIAXONE SODIUM 2 G IJ SOLR
2.0000 g | Freq: Once | INTRAMUSCULAR | Status: AC
  Administered 2022-01-14: 2 g via INTRAVENOUS
  Filled 2022-01-14: qty 20

## 2022-01-14 MED ORDER — METOPROLOL SUCCINATE ER 25 MG PO TB24
12.5000 mg | ORAL_TABLET | Freq: Every day | ORAL | Status: DC
  Administered 2022-01-14 – 2022-01-15 (×2): 12.5 mg via ORAL
  Filled 2022-01-14: qty 1
  Filled 2022-01-14: qty 0.5

## 2022-01-14 MED ORDER — HYDROMORPHONE HCL 1 MG/ML IJ SOLN
0.5000 mg | INTRAMUSCULAR | Status: DC | PRN
  Administered 2022-01-14 – 2022-01-16 (×11): 1 mg via INTRAVENOUS
  Filled 2022-01-14 (×11): qty 1

## 2022-01-14 NOTE — Progress Notes (Signed)
Have attempted to do nursing admission history twice. Was having bedside scan on first attempt. MD with patient currently. Doroteo Bradford BSN, RN-BC Throughput Nurse 01/14/2022 5:50 PM

## 2022-01-14 NOTE — H&P (Signed)
History and Physical    Patient: Bobby Gamble FMB:[id number] DOB: 06/07/1956 DOA: 01/14/2022 DOS: the patient was seen and examined on 01/14/2022 PCP: Merrilee Seashore, MD  Patient coming from: Home  Chief Complaint:  Chief Complaint  Patient presents with   Abdominal Pain   Nausea   Emesis   HPI:   Bobby Gamble is a 66 y.o. male with medical history significant of hypertension, alcoholic liver cirrhosis, with history of esophageal varices, GERD, prostate cancer, anxiety, presented to the ED, complaining of intermittent epigastric abdominal pain for about a month.  Patient reports having an episode of significant epigastric pain about a month ago prompting him to go to the ED, an EGD was supposed to be done but patient left without having it done due to prolonged stay as he is self-pay.  Patient scheduled an outpatient EGD with Dr. Therisa Doyne, which was done on 01/05/2022 which showed some grade 1 esophageal varices, which have decreased in size, multiple gastric polyps, negative for H. pylori.  Patient was advised to get an outpatient HIDA scan but has not been able to schedule it.  On 01/14/2022 morning, patient had severe epigastric pain with associated nausea and vomiting of recently ingested food, denies any hematemesis, or diarrhea, fever or chills.  Patient denies any chest pain, shortness of breath. Due to significant abdominal pain, patient presented to the ED for further management.     In the ED, vital signs fairly stable except for some uncontrolled BP likely due to pain, labs fairly stable including minimally elevated T. bili, and thrombocytopenia which is at baseline.  HIDA scan done consistent with cystic duct obstruction and acute cholecystitis, patent CBD.  RUQ ultrasound showed acute cholecystitis with 2 hypoechoic lesions in the liver measuring up to 11 mm, further evaluation with MRI recommended.  General surgery and GI consulted.  Patient admitted for further  management.  Of note, patient is self-pay.  Patient quit drinking alcohol about 2 months ago.   Review of Systems: As mentioned in the history of present illness. All other systems reviewed and are negative.   Past Medical History:  Diagnosis Date   Anxiety 06/01/2011   tx. Xanax   Arthritis 06/01/2011   hx. Gout-tx. Allopurinol   Cancer ([place]) 06/01/2011   Bx.  with dx. 5 weeks ago-Prostate    Cirrhosis ([place])    GERD (gastroesophageal reflux disease) 06/01/2011   tx. Prilosec   History of colon polyps    Hyperlipidemia 06/01/2011   Hypertension    Seasonal allergies    Sinus disorder 06/01/2011   allergies chronic/ sinus issues   Past Surgical History:  Procedure Laterality Date   BIOPSY  01/05/2022   Procedure: BIOPSY;  Surgeon: Ronnette Juniper, MD;  Location: [facility];  Service: Gastroenterology;;   COLONOSCOPY  2020   Dr Earlean Shawl    ESOPHAGOGASTRODUODENOSCOPY (EGD) WITH PROPOFOL N/A 01/05/2022   Procedure: ESOPHAGOGASTRODUODENOSCOPY (EGD) WITH PROPOFOL;  Surgeon: Ronnette Juniper, MD;  Location: [facility];  Service: Gastroenterology;  Laterality: N/A;   MANDIBLE FRACTURE SURGERY  06-01-11   Rt. lower jaw-retained hardware.   ROBOT ASSISTED LAPAROSCOPIC RADICAL PROSTATECTOMY  06/09/2011   Procedure: ROBOTIC ASSISTED LAPAROSCOPIC RADICAL PROSTATECTOMY;  Surgeon: Bernestine Amass, MD;  Location: [facility] OR[facility];  Service: Urology;  Laterality: Bilateral;  with Bilateral Lymph Node Dessection   WISDOM TOOTH EXTRACTION  06-01-11   all   Social History:  reports that he has quit smoking. He has never used smokeless tobacco. He reports that he does  not currently use alcohol after a past usage of about 4.0 standard drinks of alcohol per week. He reports that he does not use drugs.  Allergies  Allergen Reactions   Atorvastatin Other (See Comments)    Joints ache   Simvastatin Other (See Comments)    Muscle aches   Codeine Nausea And Vomiting   Hydrocodone Nausea Only, Anxiety and Other (See  Comments)    Made the patient feel sick and nervous    Family History  Problem Relation Age of Onset   Liver disease Father        unsure of what type   Esophageal cancer Neg Hx    Colon cancer Neg Hx    Pancreatic cancer Neg Hx    Pancreatic disease Neg Hx    Colon polyps Neg Hx    Prostate cancer Neg Hx    Rectal cancer Neg Hx    Stomach cancer Neg Hx     Prior to Admission medications   Medication Sig Start Date End Date Taking? Authorizing Provider  ALPRAZolam Duanne Moron) 0.5 MG tablet Take 0.5 mg by mouth daily as needed for anxiety.   Yes [provider]  Cholecalciferol (VITAMIN D3) 2000 UNITS TABS Take 4,000 Units by mouth every morning.   Yes [provider]  Cinnamon 500 MG capsule Take 2,000 mg by mouth in the morning.   Yes [provider]  esomeprazole (NEXIUM 24HR) 20 MG capsule Take 40 mg by mouth in the morning and at bedtime.   Yes [provider]  fexofenadine (ALLEGRA) 180 MG tablet Take 180 mg by mouth daily.   Yes [provider]  fluticasone (FLONASE) 50 MCG/ACT nasal spray Place 1 spray into both nostrils daily as needed for allergies.   Yes [provider]  hydrOXYzine (ATARAX/VISTARIL) 25 MG tablet Take 50 mg by mouth at bedtime. 08/08/19  Yes [provider]  ibuprofen (ADVIL) 200 MG tablet Take 400 mg by mouth every 6 (six) hours as needed for mild pain or headache.   Yes [provider]  losartan (COZAAR) 100 MG tablet Take 50 mg by mouth at bedtime. 04/13/20  Yes [provider]  metoprolol succinate (TOPROL-XL) 25 MG 24 hr tablet Take 12.5 mg by mouth at bedtime. 11/23/21  Yes [provider]  mupirocin ointment (BACTROBAN) 2 % Apply 1 application  topically daily as needed (for breakouts on the nose).   Yes [provider]  Omega-3 Fatty Acids (FISH OIL) 1000 MG CAPS Take 1,000 mg by mouth daily with breakfast.   Yes [provider]  sucralfate (CARAFATE)  1 g tablet Take 1 g by mouth 2 (two) times daily before a meal.   Yes [provider]    Physical Exam: Vitals:   01/14/22 1636 01/14/22 1659 01/14/22 1700 01/14/22 1702  BP:  121/80 124/77 124/77  Pulse: 92  82 84  Resp:    16  Temp:      TempSrc:      SpO2: 99%  97% 92%   General: NAD  Cardiovascular: S1, S2 present Respiratory: CTAB Abdomen: Soft, +tender, nondistended, bowel sounds present Musculoskeletal: No bilateral pedal edema noted Skin: Normal Psychiatry: Normal mood    Data Reviewed:  Labs fairly stable including minimally elevated T. bili, and thrombocytopenia which is at baseline.  HIDA scan done consistent with cystic duct obstruction and acute cholecystitis, patent CBD.  RUQ ultrasound showed acute cholecystitis with 2 hypoechoic lesions in the liver measuring up to 11  mm, further evaluation with MRI recommended  Assessment and Plan:  Acute cholecystitis Currently afebrile, with no leukocytosis LFTs fairly stable, AST 44, T. bili 1.3 HIDA scan done consistent with cystic duct obstruction and acute cholecystitis, patent CBD  RUQ ultrasound showed acute cholecystitis with 2 hypoechoic lesions in the liver measuring up to 11 mm, further evaluation with MRI recommended General surgery consulted, plan to see patient for possible cholecystectomy GI consulted, will see patient in the a.m. NPO, IV fluids, IV ceftriaxone, antiemetics, pain management Monitor closely  Hypertension BP stable Hold home losartan for now, okay to use metoprolol perioperatively  Alcoholic liver cirrhosis- currently compensated History of esophageal varices History of alcohol abuse-quit about 2 months ago Last EGD on 01/05/2022 which showed some grade 1 esophageal varices, which have decreased in size, multiple gastric polyps, negative for H. Pylori  Thrombocytopenia Likely 2/2 above Daily CBC  Hyperglycemia Random glucose 269 A1c pending  GERD Continue PPI  History of  prostate cancer S/p prostatectomy  Anxiety Continue Xanax     Advance Care Planning: Full code  Consults: General surgery, GI  Family Communication: Discussed with wife at bedside  Severity of Illness: The appropriate patient status for this patient is INPATIENT. Inpatient status is judged to be reasonable and necessary in order to provide the required intensity of service to ensure the patient's safety. The patient's presenting symptoms, physical exam findings, and initial radiographic and laboratory data in the context of their chronic comorbidities is felt to place them at high risk for further clinical deterioration. Furthermore, it is not anticipated that the patient will be medically stable for discharge from the hospital within 2 midnights of admission.   * I certify that at the point of admission it is my clinical judgment that the patient will require inpatient hospital care spanning beyond 2 midnights from the point of admission due to high intensity of service, high risk for further deterioration and high frequency of surveillance required.*  Author: Alma Friendly, MD 01/14/2022 6:10 PM  For on call review [email or web address].

## 2022-01-14 NOTE — ED Provider Triage Note (Cosign Needed)
Emergency Medicine Provider Triage Evaluation Note  Bobby Gamble , a 66 y.o. male  was evaluated in triage.  Pt complains of epigastric pain, nausea, vomiting that began first thing this morning.  The patient states that he had an EGD on June 13 which showed improving esophageal varices.  Biopsy was apparently done showing no H. pylori.  Patient has history of similar complaints most recently admitted to [facility] on May 13.  Denies shortness of breath.  Does endorse intermittent chest pain but states he believes it is from muscular contractions when he is vomiting.  Review of Systems  Positive: Abdominal pain, nausea, vomiting Negative: Shortness of breath  Physical Exam  BP (!) 174/109 (BP Location: Right Arm)   Pulse 81   Temp 97.8 F (36.6 C) (Oral)   Resp (!) 29   SpO2 100%  Gen:   Awake, no distress   Resp:  Normal effort  MSK:   Moves extremities without difficulty  Other:    Medical Decision Making  Medically screening exam initiated at 11:15 AM.  Appropriate orders placed.  [name] was informed that the remainder of the evaluation will be completed by another provider, this initial triage assessment does not replace that evaluation, and the importance of remaining in the ED until their evaluation is complete.     Dorothyann Peng, PA-C 01/14/22 1117

## 2022-01-14 NOTE — ED Triage Notes (Signed)
Patient here from home brought in by wife reporting upper epigastric abd pain, n/v that started last night. Recent EGD "that look good". Hx of alcoholic cirrhosis. Denies any alcohol.

## 2022-01-15 ENCOUNTER — Other Ambulatory Visit: Payer: Self-pay

## 2022-01-15 ENCOUNTER — Inpatient Hospital Stay (HOSPITAL_COMMUNITY): Payer: Self-pay

## 2022-01-15 ENCOUNTER — Encounter (HOSPITAL_COMMUNITY): Payer: Self-pay | Admitting: Internal Medicine

## 2022-01-15 HISTORY — PX: IR PERC CHOLECYSTOSTOMY: IMG2326

## 2022-01-15 LAB — CBC
HCT: 35.7 % — ABNORMAL LOW (ref 39.0–52.0)
Hemoglobin: 12.8 g/dL — ABNORMAL LOW (ref 13.0–17.0)
MCH: 34.1 pg — ABNORMAL HIGH (ref 26.0–34.0)
MCHC: 35.9 g/dL (ref 30.0–36.0)
MCV: 95.2 fL (ref 80.0–100.0)
Platelets: 91 10*3/uL — ABNORMAL LOW (ref 150–400)
RBC: 3.75 MIL/uL — ABNORMAL LOW (ref 4.22–5.81)
RDW: 13.9 % (ref 11.5–15.5)
WBC: 8.4 10*3/uL (ref 4.0–10.5)
nRBC: 0 % (ref 0.0–0.2)

## 2022-01-15 LAB — COMPREHENSIVE METABOLIC PANEL
ALT: 30 U/L (ref 0–44)
AST: 32 U/L (ref 15–41)
Albumin: 3.2 g/dL — ABNORMAL LOW (ref 3.5–5.0)
Alkaline Phosphatase: 70 U/L (ref 38–126)
Anion gap: 6 (ref 5–15)
BUN: 19 mg/dL (ref 8–23)
CO2: 23 mmol/L (ref 22–32)
Calcium: 8.4 mg/dL — ABNORMAL LOW (ref 8.9–10.3)
Chloride: 113 mmol/L — ABNORMAL HIGH (ref 98–111)
Creatinine, Ser: 1.18 mg/dL (ref 0.61–1.24)
GFR, Estimated: >60 mL/min (ref >60)
Glucose, Bld: 125 mg/dL — ABNORMAL HIGH (ref 70–99)
Potassium: 3.4 mmol/L — ABNORMAL LOW (ref 3.5–5.1)
Sodium: 142 mmol/L (ref 135–145)
Total Bilirubin: 1.5 mg/dL — ABNORMAL HIGH (ref 0.3–1.2)
Total Protein: 6.3 g/dL — ABNORMAL LOW (ref 6.5–8.1)

## 2022-01-15 LAB — PROTIME-INR
INR: 1.2 (ref 0.8–1.2)
Prothrombin Time: 15.4 seconds — ABNORMAL HIGH (ref 11.4–15.2)

## 2022-01-15 LAB — GLUCOSE, CAPILLARY
Glucose-Capillary: 110 mg/dL — ABNORMAL HIGH (ref 70–99)
Glucose-Capillary: 108 mg/dL — ABNORMAL HIGH (ref 70–99)

## 2022-01-15 MED ORDER — FENTANYL CITRATE (PF) 100 MCG/2ML IJ SOLN
INTRAMUSCULAR | Status: AC | PRN
  Administered 2022-01-15: 50 ug via INTRAVENOUS

## 2022-01-15 MED ORDER — HYDROMORPHONE HCL 1 MG/ML IJ SOLN
0.5000 mg | Freq: Once | INTRAMUSCULAR | Status: AC
  Administered 2022-01-15: 0.5 mg via INTRAVENOUS
  Filled 2022-01-15: qty 0.5

## 2022-01-15 MED ORDER — KETOROLAC TROMETHAMINE 30 MG/ML IJ SOLN
INTRAMUSCULAR | Status: AC
  Filled 2022-01-15: qty 1

## 2022-01-15 MED ORDER — LIDOCAINE HCL 1 % IJ SOLN
INTRAMUSCULAR | Status: AC
  Filled 2022-01-15: qty 20

## 2022-01-15 MED ORDER — IOHEXOL 300 MG/ML  SOLN
5.0000 mL | Freq: Once | INTRAMUSCULAR | Status: AC | PRN
  Administered 2022-01-15: 5 mL

## 2022-01-15 MED ORDER — MIDAZOLAM HCL 2 MG/2ML IJ SOLN
INTRAMUSCULAR | Status: AC
  Filled 2022-01-15: qty 4

## 2022-01-15 MED ORDER — FENTANYL CITRATE (PF) 100 MCG/2ML IJ SOLN
INTRAMUSCULAR | Status: AC
  Filled 2022-01-15: qty 4

## 2022-01-15 MED ORDER — MIDAZOLAM HCL 2 MG/2ML IJ SOLN
INTRAMUSCULAR | Status: AC | PRN
  Administered 2022-01-15: 2 mg via INTRAVENOUS

## 2022-01-15 MED ORDER — INSULIN ASPART 100 UNIT/ML IJ SOLN: 0.0000 [IU] | Freq: Three times a day (TID) | INTRAMUSCULAR | Status: DC

## 2022-01-15 MED ORDER — KETOROLAC TROMETHAMINE 15 MG/ML IJ SOLN
INTRAMUSCULAR | Status: AC | PRN
  Administered 2022-01-15: 15 mg via INTRAVENOUS

## 2022-01-15 MED ORDER — SODIUM CHLORIDE 0.9% FLUSH
5.0000 mL | Freq: Three times a day (TID) | INTRAVENOUS | Status: DC
  Administered 2022-01-15 – 2022-01-16 (×4): 5 mL

## 2022-01-15 NOTE — H&P (Signed)
Chief Complaint: Patient was seen in consultation today for acute cholecystitis  at the request of Leary Roca, [place]  Referring Physician(s): Leary Roca, [place]  Supervising Physician: Gilmer Mor  Patient Status: [facility] - In-pt  History of Present Illness: Bobby Gamble is a 66 y.o. male past medical history significant for HTN, alcoholic liver cirrhosis, esophageal varices, GERD, prostate cancer, anxiety. Patient reports he had outpatient EDG with Dr. Pati Gallo 01/05/2022 that showed grade 1 esophageal varices, multiple gastric polyps and negative for H. pylori.  Patient presented to ED 01/14/2022 complaining of severe epigastric pain with nausea vomiting.  Labs/slightly elevated T. bili and thrombocytopenia which is at his baseline.  HIDA scan demonstrated consistent cystic duct obstruction and acute cholecystitis.  General surgery has referred patient to IR for percutaneous cholecystostomy drain.  This procedure was approved by Dr. Loreta Ave.  NM Hepatobiliary 01/14/22:  IMPRESSION: Patent CBD.   Nonvisualization of the gallbladder despite morphine augmentation consistent with cystic duct obstruction and acute cholecystitis.  Past Medical History:  Diagnosis Date   Anxiety 06/01/2011   tx. Xanax   Arthritis 06/01/2011   hx. Gout-tx. Allopurinol   Cancer (HCC) 06/01/2011   Bx.  with dx. 5 weeks ago-Prostate    Cirrhosis (HCC)    GERD (gastroesophageal reflux disease) 06/01/2011   tx. Prilosec   History of colon polyps    Hyperlipidemia 06/01/2011   Hypertension    Seasonal allergies    Sinus disorder 06/01/2011   allergies chronic/ sinus issues    Past Surgical History:  Procedure Laterality Date   BIOPSY  01/05/2022   Procedure: BIOPSY;  Surgeon: Kerin Salen, MD;  Location: [facility];  Service: Gastroenterology;;   COLONOSCOPY  2020   Dr Kinnie Scales    ESOPHAGOGASTRODUODENOSCOPY (EGD) WITH PROPOFOL N/A 01/05/2022   Procedure: ESOPHAGOGASTRODUODENOSCOPY (EGD) WITH  PROPOFOL;  Surgeon: Kerin Salen, MD;  Location: [facility];  Service: Gastroenterology;  Laterality: N/A;   MANDIBLE FRACTURE SURGERY  06-01-11   Rt. lower jaw-retained hardware.   ROBOT ASSISTED LAPAROSCOPIC RADICAL PROSTATECTOMY  06/09/2011   Procedure: ROBOTIC ASSISTED LAPAROSCOPIC RADICAL PROSTATECTOMY;  Surgeon: Valetta Fuller, MD;  Location: [facility];  Service: Urology;  Laterality: Bilateral;  with Bilateral Lymph Node Dessection   WISDOM TOOTH EXTRACTION  06-01-11   all    Allergies: Atorvastatin, Simvastatin, Codeine, and Hydrocodone  Medications: Prior to Admission medications   Medication Sig Start Date End Date Taking? Authorizing Provider  ALPRAZolam Prudy Feeler) 0.5 MG tablet Take 0.5 mg by mouth daily as needed for anxiety.   Yes [provider]  Cholecalciferol (VITAMIN D3) 2000 UNITS TABS Take 4,000 Units by mouth every morning.   Yes [provider]  Cinnamon 500 MG capsule Take 2,000 mg by mouth in the morning.   Yes [provider]  esomeprazole (NEXIUM 24HR) 20 MG capsule Take 40 mg by mouth in the morning and at bedtime.   Yes [provider]  fexofenadine (ALLEGRA) 180 MG tablet Take 180 mg by mouth daily.   Yes [provider]  fluticasone (FLONASE) 50 MCG/ACT nasal spray Place 1 spray into both nostrils daily as needed for allergies.   Yes [provider]  hydrOXYzine (ATARAX/VISTARIL) 25 MG tablet Take 50 mg by mouth at bedtime. 08/08/19  Yes [provider]  ibuprofen (ADVIL) 200 MG tablet Take 400 mg by mouth every 6 (six) hours as needed for mild pain or headache.   Yes [provider]  losartan (COZAAR) 100 MG tablet Take 50 mg  by mouth at bedtime. 04/13/20  Yes [provider]  metoprolol succinate (TOPROL-XL) 25 MG 24 hr tablet Take 12.5 mg by mouth at bedtime. 11/23/21  Yes [provider]  mupirocin ointment (BACTROBAN) 2 % Apply 1 application  topically daily as needed (for  breakouts on the nose).   Yes [provider]  Omega-3 Fatty Acids (FISH OIL) 1000 MG CAPS Take 1,000 mg by mouth daily with breakfast.   Yes [provider]  sucralfate (CARAFATE) 1 g tablet Take 1 g by mouth 2 (two) times daily before a meal.   Yes [provider]     Family History  Problem Relation Age of Onset   Liver disease Father        unsure of what type   Esophageal cancer Neg Hx    Colon cancer Neg Hx    Pancreatic cancer Neg Hx    Pancreatic disease Neg Hx    Colon polyps Neg Hx    Prostate cancer Neg Hx    Rectal cancer Neg Hx    Stomach cancer Neg Hx     Social History   Socioeconomic History   Marital status: Married    Spouse name: Not on file   Number of children: Not on file   Years of education: Not on file   Highest education level: Not on file  Occupational History   Not on file  Tobacco Use   Smoking status: Former   Smokeless tobacco: Never  Vaping Use   Vaping Use: Never used  Substance and Sexual Activity   Alcohol use: Not Currently    Alcohol/week: 4.0 standard drinks of alcohol    Types: 4 Shots of liquor per week   Drug use: No   Sexual activity: Yes  Other Topics Concern   Not on file  Social History Narrative   Not on file   Social Determinants of Health   Financial Resource Strain: Not on file  Food Insecurity: Not on file  Transportation Needs: Not on file  Physical Activity: Not on file  Stress: Not on file  Social Connections: Not on file    Review of Systems: A 12 point ROS discussed and pertinent positives are indicated in the HPI above.  All other systems are negative.  Review of Systems  Constitutional:  Positive for fatigue. Negative for chills and fever.  Respiratory:  Negative for shortness of breath.   Cardiovascular:  Negative for chest pain.  Gastrointestinal:  Positive for abdominal pain, nausea and vomiting.  Neurological:  Negative for dizziness, weakness and headaches.     Vital Signs: BP (!) 118/92   Pulse 67   Temp 97.8 F (36.6 C) (Oral)   Resp 18   SpO2 93%     Physical Exam Vitals reviewed.  Constitutional:      General: He is not in acute distress.    Appearance: Normal appearance. He is not ill-appearing.  HENT:     Head: Normocephalic and atraumatic.     Mouth/Throat:     Mouth: Mucous membranes are dry.     Pharynx: Oropharynx is clear.  Eyes:     Extraocular Movements: Extraocular movements intact.     Pupils: Pupils are equal, round, and reactive to light.  Cardiovascular:     Rate and Rhythm: Normal rate and regular rhythm.     Pulses: Normal pulses.     Heart sounds: Normal heart sounds.  Pulmonary:     Effort: Pulmonary effort is normal.  No respiratory distress.     Breath sounds: Normal breath sounds.  Abdominal:     General: Bowel sounds are normal. There is no distension.     Palpations: Abdomen is soft.     Tenderness: There is no abdominal tenderness. There is no guarding.  Musculoskeletal:     Right lower leg: No edema.     Left lower leg: No edema.  Skin:    General: Skin is warm and dry.  Neurological:     Mental Status: He is alert and oriented to person, place, and time.  Psychiatric:        Mood and Affect: Mood normal.        Behavior: Behavior normal.        Thought Content: Thought content normal.        Judgment: Judgment normal.     Imaging: US Abdomen Limited RUQ (LIVER/GB)  Result Date: 01/14/2022 CLINICAL DATA:  Right upper quadrant pain. EXAM: ULTRASOUND ABDOMEN LIMITED RIGHT UPPER QUADRANT COMPARISON:  Nuclear medicine hepatobiliary scan 01/14/2022. CT abdomen and pelvis 12/05/2021. MRI abdomen 12/11/2020. ultrasound abdomen 09/30/2020. FINDINGS: Gallbladder: The gallbladder wall is thickened measuring up to 7.2 mm. Pericholecystic fluid is present. Sonographic Murphy sign is positive. There is sludge throughout the gallbladder. Common bile duct: Diameter: 2.5 mm. Liver: The liver parenchyma  is diffusely heterogeneous. There is an 8 x 8 x 9 mm hypoechoic area in the left lobe of the liver and in 11 by 10 x 11 mm hypoechoic area in the right lobe of the liver. Portal vein is patent on color Doppler imaging with normal direction of blood flow towards the liver. Other: None. IMPRESSION: 1. Gallbladder sludge with gallbladder wall thickening, pericholecystic fluid and positive sonographic Murphy sign. Findings are compatible with acute cholecystitis corresponding to recent hepatobiliary scan. 2. There are 2 hypoechoic lesions in the liver which are indeterminate measuring up to 11 mm. Recommend further evaluation with MRI. Electronically Signed   By: Darliss Cheney M.D.   On: 01/14/2022 17:56   NM Hepatobiliary Liver Func  Result Date: 01/14/2022 CLINICAL DATA:  Severe abdominal pain, nausea, and vomiting since early a.m. EXAM: NUCLEAR MEDICINE HEPATOBILIARY IMAGING TECHNIQUE: Sequential images of the abdomen were obtained out to 60 minutes following intravenous administration of radiopharmaceutical. RADIOPHARMACEUTICALS:  5.4 mCi Tc-65m  Choletec IV COMPARISON:  CT abdomen and pelvis 12/05/2021 FINDINGS: Normal tracer extraction from bloodstream indicating normal hepatocellular function. Prompt excretion of tracer into biliary tree. Small bowel visualized by 10 minutes. At 1 hour, gallbladder had not visualized. Patient then received 3 mg of morphine IV and imaging was continued for 30 minutes. Gallbladder failed to visualize following morphine augmentation. Findings are consistent with cystic duct obstruction and acute cholecystitis. IMPRESSION: Patent CBD. Nonvisualization of the gallbladder despite morphine augmentation consistent with cystic duct obstruction and acute cholecystitis. Electronically Signed   By: Ulyses Southward M.D.   On: 01/14/2022 16:25    Labs:  CBC: Recent Labs    12/05/21 1550 12/05/21 2347 12/06/21 0539 12/06/21 1133 01/14/22 1125 01/15/22 0402  WBC 6.3  --   --  4.5  4.9 8.4  HGB 14.5   < > 12.6* 12.3* 14.4 12.8*  HCT 40.3  --   --  34.1* 39.2 35.7*  PLT 103*  --   --  66* 92* 91*   < > = values in this interval not displayed.    COAGS: Recent Labs    12/05/21 1618 01/14/22 2119 01/15/22 0402  INR 1.1  1.2 1.2    BMP: Recent Labs    12/05/21 1550 12/06/21 1133 01/14/22 1125 01/15/22 0402  NA 138 138 136 142  K 3.4* 4.0 3.9 3.4*  CL 105 109 105 113*  CO2 19* 24 19* 23  GLUCOSE 199* 133* 269* 125*  BUN 24* 18 16 19   CALCIUM 9.8 8.4* 9.5 8.4*  CREATININE 1.19 1.05 1.07 1.18  GFRNONAA >60 >60 >60 >60    LIVER FUNCTION TESTS: Recent Labs    12/05/21 1551 01/14/22 1125 01/15/22 0402  BILITOT 1.5* 1.3* 1.5*  AST 46* 44* 32  ALT 44 39 30  ALKPHOS 133* 139* 70  PROT 7.3 7.6 6.3*  ALBUMIN 3.8 3.8 3.2*    TUMOR MARKERS: No results for input(s): "AFPTM", "CEA", "CA199", "CHROMGRNA" in the last 8760 hours.  Assessment and Plan: History of HTN, alcoholic liver cirrhosis, esophageal varices, GERD, prostate cancer, anxiety. Patient reports he had outpatient EDG with Dr. Pati Gallo 01/05/2022 that showed grade 1 esophageal varices, multiple gastric polyps and negative for H. pylori.  Patient presented to ED 01/14/2022 complaining of severe epigastric pain with nausea vomiting.  Labs/slightly elevated T. bili and thrombocytopenia which is at his baseline.  HIDA scan demonstrated consistent cystic duct obstruction and acute cholecystitis.  General surgery has referred patient to IR for percutaneous cholecystostomy drain.  This procedure was approved by Dr. Loreta Ave.  NM Hepatobiliary 01/14/22:  IMPRESSION: Patent CBD.   Nonvisualization of the gallbladder despite morphine augmentation consistent with cystic duct obstruction and acute cholecystitis.  Pt resting in bed with wife at bedside.  He is A&O, calm and pleasant.  He is in no distress. Pt states he is NPO per order.  He denies the use of blood thinning medications.   Risks and benefits  discussed with the patient including, but not limited to bleeding, infection, gallbladder perforation, bile leak, sepsis or even death.  All of the patient's questions were answered, patient is agreeable to proceed. Consent signed and in chart.   Thank you for this interesting consult.  I greatly enjoyed meeting Bobby Gamble and look forward to participating in their care.  A copy of this report was sent to the requesting provider on this date.  Electronically Signed: Shon Hough, NP 01/15/2022, 9:21 AM   I spent a total of 20 minutes in face to face in clinical consultation, greater than 50% of which was counseling/coordinating care for acute cholecystitis.

## 2022-01-15 NOTE — Progress Notes (Signed)
PROGRESS NOTE  TRENITY BASURTO EXB:[id number] DOB: Apr 04, 1956 DOA: 01/14/2022 PCP: Georgianne Fick, MD  HPI/Recap of past 24 hours: SASAN BUNYARD is a 66 y.o. male with medical history significant of hypertension, alcoholic liver cirrhosis, with history of esophageal varices, GERD, prostate cancer, anxiety, presented to the ED, complaining of intermittent epigastric abdominal pain for about a month. On 01/14/2022 morning, patient had severe epigastric pain with associated nausea and vomiting of recently ingested food. Due to significant abdominal pain, patient presented to the ED for further management. In the ED, vital signs fairly stable except for some uncontrolled BP likely due to pain, labs fairly stable including minimally elevated T. bili, and thrombocytopenia which is at baseline.  HIDA scan done consistent with cystic duct obstruction and acute cholecystitis, patent CBD.  RUQ ultrasound showed acute cholecystitis with 2 hypoechoic lesions in the liver measuring up to 11 mm, further evaluation with MRI recommended.  General surgery and GI consulted.  Patient admitted for further management.  Of note, patient is self-pay.  Patient quit drinking alcohol about 2 months ago.    Today, patient denies any new complaints, still with some epigastric tenderness denies any further nausea/vomiting, chest pain, fever/chills.   Assessment/Plan: Principal Problem:   Acute cholecystitis Active Problems:   Esophageal varices in alcoholic cirrhosis (HCC)   Hypertension   Personal history of prostate cancer   Acute cholecystitis Currently afebrile, with no leukocytosis LFTs fairly stable, AST 44, T. bili 1.3 HIDA scan done consistent with cystic duct obstruction and acute cholecystitis, patent CBD RUQ ultrasound showed acute cholecystitis with 2 hypoechoic lesions in the liver measuring up to 11 mm, further evaluation with MRI recommended General surgery consulted, plan for percutaneous  cholecystostomy for about 6 to 8 weeks then proceed with cholecystectomy due to significant inflammation in a patient with liver cirrhosis GI consulted, no further recs IR consulted, plan for percutaneous cholecystostomy on 01/15/2022 NPO, IV fluids, IV ceftriaxone, antiemetics, pain management Monitor closely   Hypertension BP stable Hold home losartan for now, okay to use metoprolol perioperatively   Alcoholic liver cirrhosis- currently compensated History of esophageal varices History of alcohol abuse-quit about 2 months ago Last EGD on 01/05/2022 which showed some grade 1 esophageal varices, which have decreased in size, multiple gastric polyps, negative for H. Pylori   Thrombocytopenia Likely 2/2 above Daily CBC   Diabetes mellitus type 2 Last A1c 6.8 SSI, Accu-Cheks, hypoglycemic protocol   GERD Continue PPI   History of prostate cancer S/p prostatectomy   Anxiety Continue Xanax     Estimated body mass index is 26.77 kg/m as calculated from the following:   Height as of this encounter: 5\' 7"  (1.702 m).   Weight as of this encounter: 77.5 kg.     Code Status: Full  Family Communication: Discussed with wife at bedside  Disposition Plan: Status is: Inpatient Remains inpatient appropriate because: Level of care      Consultants: General surgery GI IR  Procedures: Percutaneous cholecystostomy  Antimicrobials: Ceftriaxone  DVT prophylaxis: SCDs   Objective: Vitals:   01/15/22 1211 01/15/22 1313 01/15/22 1343 01/15/22 1513  BP:    127/82  Pulse:    64  Resp:  15 20   Temp:    98.8 F (37.1 C)  TempSrc:    Oral  SpO2:    94%  Weight: 77.5 kg     Height: 5\' 7"  (1.702 m)      No intake or output data in the 24 hours  ending 01/15/22 1545 Filed Weights   01/15/22 1211  Weight: 77.5 kg    Exam: General: NAD  Cardiovascular: S1, S2 present Respiratory: CTAB Abdomen: Soft, +tender, nondistended, bowel sounds present Musculoskeletal: No  bilateral pedal edema noted Skin: Normal Psychiatry: Normal mood     Data Reviewed: CBC: Recent Labs  Lab 01/14/22 1125 01/15/22 0402  WBC 4.9 8.4  HGB 14.4 12.8*  HCT 39.2 35.7*  MCV 93.6 95.2  PLT 92* 91*   Basic Metabolic Panel: Recent Labs  Lab 01/14/22 1125 01/15/22 0402  NA 136 142  K 3.9 3.4*  CL 105 113*  CO2 19* 23  GLUCOSE 269* 125*  BUN 16 19  CREATININE 1.07 1.18  CALCIUM 9.5 8.4*   GFR: Estimated Creatinine Clearance: 59.9 mL/min (by C-G formula based on SCr of 1.18 mg/dL). Liver Function Tests: Recent Labs  Lab 01/14/22 1125 01/15/22 0402  AST 44* 32  ALT 39 30  ALKPHOS 139* 70  BILITOT 1.3* 1.5*  PROT 7.6 6.3*  ALBUMIN 3.8 3.2*   Recent Labs  Lab 01/14/22 1125  LIPASE 41   No results for input(s): "AMMONIA" in the last 168 hours. Coagulation Profile: Recent Labs  Lab 01/14/22 2119 01/15/22 0402  INR 1.2 1.2   Cardiac Enzymes: No results for input(s): "CKTOTAL", "CKMB", "CKMBINDEX", "TROPONINI" in the last 168 hours. BNP (last 3 results) No results for input(s): "PROBNP" in the last 8760 hours. HbA1C: Recent Labs    01/14/22 1125  HGBA1C 6.8*   CBG: No results for input(s): "GLUCAP" in the last 168 hours. Lipid Profile: No results for input(s): "CHOL", "HDL", "LDLCALC", "TRIG", "CHOLHDL", "LDLDIRECT" in the last 72 hours. Thyroid Function Tests: No results for input(s): "TSH", "T4TOTAL", "FREET4", "T3FREE", "THYROIDAB" in the last 72 hours. Anemia Panel: No results for input(s): "VITAMINB12", "FOLATE", "FERRITIN", "TIBC", "IRON", "RETICCTPCT" in the last 72 hours. Urine analysis:    Component Value Date/Time   COLORURINE YELLOW 01/14/2022 1137   APPEARANCEUR CLEAR 01/14/2022 1137   LABSPEC 1.015 01/14/2022 1137   PHURINE 9.0 (H) 01/14/2022 1137   GLUCOSEU 150 (A) 01/14/2022 1137   HGBUR NEGATIVE 01/14/2022 1137   BILIRUBINUR NEGATIVE 01/14/2022 1137   KETONESUR NEGATIVE 01/14/2022 1137   PROTEINUR NEGATIVE  01/14/2022 1137   UROBILINOGEN 0.2 04/30/2009 1156   NITRITE NEGATIVE 01/14/2022 1137   LEUKOCYTESUR NEGATIVE 01/14/2022 1137   Sepsis Labs: @LABRCNTIP (procalcitonin:4,lacticidven:4)  ) Recent Results (from the past 240 hour(s))  SARS Coronavirus 2 by RT PCR (hospital order, performed in [facility] hospital lab) *cepheid single result test* Anterior Nasal Swab     Status: None   Collection Time: 01/14/22  6:51 PM   Specimen: Anterior Nasal Swab  Result Value Ref Range Status   SARS Coronavirus 2 by RT PCR NEGATIVE NEGATIVE Final    Comment: (NOTE) SARS-CoV-2 target nucleic acids are NOT DETECTED.  The SARS-CoV-2 RNA is generally detectable in upper and lower respiratory specimens during the acute phase of infection. The lowest concentration of SARS-CoV-2 viral copies this assay can detect is 250 copies / mL. A negative result does not preclude SARS-CoV-2 infection and should not be used as the sole basis for treatment or other patient management decisions.  A negative result may occur with improper specimen collection / handling, submission of specimen other than nasopharyngeal swab, presence of viral mutation(s) within the areas targeted by this assay, and inadequate number of viral copies (<250 copies / mL). A negative result must be combined with clinical observations, patient history, and epidemiological  information.  Fact Sheet for Patients:   RoadLapTop.co.za  Fact Sheet for Healthcare Providers: http://kim-miller.com/  This test is not yet approved or  cleared by the [place] FDA and has been authorized for detection and/or diagnosis of SARS-CoV-2 by FDA under an Emergency Use Authorization (EUA).  This EUA will remain in effect (meaning this test can be used) for the duration of the COVID-19 declaration under Section 564(b)(1) of the Act, 21 U.S.C. section 360bbb-3(b)(1), unless the authorization is terminated  or revoked sooner.  Performed at [facility], [address], [place], [place] [address]       Studies: US Abdomen Limited RUQ (LIVER/GB)  Result Date: 01/14/2022 CLINICAL DATA:  Right upper quadrant pain. EXAM: ULTRASOUND ABDOMEN LIMITED RIGHT UPPER QUADRANT COMPARISON:  Nuclear medicine hepatobiliary scan 01/14/2022. CT abdomen and pelvis 12/05/2021. MRI abdomen 12/11/2020. ultrasound abdomen 09/30/2020. FINDINGS: Gallbladder: The gallbladder wall is thickened measuring up to 7.2 mm. Pericholecystic fluid is present. Sonographic Murphy sign is positive. There is sludge throughout the gallbladder. Common bile duct: Diameter: 2.5 mm. Liver: The liver parenchyma is diffusely heterogeneous. There is an 8 x 8 x 9 mm hypoechoic area in the left lobe of the liver and in 11 by 10 x 11 mm hypoechoic area in the right lobe of the liver. Portal vein is patent on color Doppler imaging with normal direction of blood flow towards the liver. Other: None. IMPRESSION: 1. Gallbladder sludge with gallbladder wall thickening, pericholecystic fluid and positive sonographic Murphy sign. Findings are compatible with acute cholecystitis corresponding to recent hepatobiliary scan. 2. There are 2 hypoechoic lesions in the liver which are indeterminate measuring up to 11 mm. Recommend further evaluation with MRI. Electronically Signed   By: Darliss Cheney M.D.   On: 01/14/2022 17:56   NM Hepatobiliary Liver Func  Result Date: 01/14/2022 CLINICAL DATA:  Severe abdominal pain, nausea, and vomiting since early a.m. EXAM: NUCLEAR MEDICINE HEPATOBILIARY IMAGING TECHNIQUE: Sequential images of the abdomen were obtained out to 60 minutes following intravenous administration of radiopharmaceutical. RADIOPHARMACEUTICALS:  5.4 mCi Tc-34m  Choletec IV COMPARISON:  CT abdomen and pelvis 12/05/2021 FINDINGS: Normal tracer extraction from bloodstream indicating normal hepatocellular function. Prompt excretion of tracer  into biliary tree. Small bowel visualized by 10 minutes. At 1 hour, gallbladder had not visualized. Patient then received 3 mg of morphine IV and imaging was continued for 30 minutes. Gallbladder failed to visualize following morphine augmentation. Findings are consistent with cystic duct obstruction and acute cholecystitis. IMPRESSION: Patent CBD. Nonvisualization of the gallbladder despite morphine augmentation consistent with cystic duct obstruction and acute cholecystitis. Electronically Signed   By: Ulyses Southward M.D.   On: 01/14/2022 16:25    Scheduled Meds:  fentaNYL       midazolam       metoprolol succinate  12.5 mg Oral QHS   morphine (PF)       pantoprazole (PROTONIX) IV  40 mg Intravenous Q12H    Continuous Infusions:  sodium chloride 100 mL/hr at 01/15/22 1130   cefTRIAXone (ROCEPHIN)  IV       LOS: 1 day     Briant Cedar, MD [facility]  If 7PM-7AM, please contact night-coverage [email or web address] 01/15/2022, 3:45 PM

## 2022-01-16 LAB — CBC WITH DIFFERENTIAL/PLATELET
Abs Immature Granulocytes: 0.01 10*3/uL (ref 0.00–0.07)
Basophils Absolute: 0 10*3/uL (ref 0.0–0.1)
Basophils Relative: 1 %
Eosinophils Absolute: 0.2 10*3/uL (ref 0.0–0.5)
Eosinophils Relative: 5 %
HCT: 33 % — ABNORMAL LOW (ref 39.0–52.0)
Hemoglobin: 11.6 g/dL — ABNORMAL LOW (ref 13.0–17.0)
Immature Granulocytes: 0 %
Lymphocytes Relative: 37 %
Lymphs Abs: 1.5 10*3/uL (ref 0.7–4.0)
MCH: 34 pg (ref 26.0–34.0)
MCHC: 35.2 g/dL (ref 30.0–36.0)
MCV: 96.8 fL (ref 80.0–100.0)
Monocytes Absolute: 0.5 10*3/uL (ref 0.1–1.0)
Monocytes Relative: 12 %
Neutro Abs: 1.8 10*3/uL (ref 1.7–7.7)
Neutrophils Relative %: 45 %
Platelets: 63 10*3/uL — ABNORMAL LOW (ref 150–400)
RBC: 3.41 MIL/uL — ABNORMAL LOW (ref 4.22–5.81)
RDW: 13.8 % (ref 11.5–15.5)
WBC: 4 10*3/uL (ref 4.0–10.5)
nRBC: 0 % (ref 0.0–0.2)

## 2022-01-16 LAB — COMPREHENSIVE METABOLIC PANEL
ALT: 31 U/L (ref 0–44)
AST: 33 U/L (ref 15–41)
Albumin: 3.1 g/dL — ABNORMAL LOW (ref 3.5–5.0)
Alkaline Phosphatase: 64 U/L (ref 38–126)
Anion gap: 7 (ref 5–15)
BUN: 18 mg/dL (ref 8–23)
CO2: 24 mmol/L (ref 22–32)
Calcium: 8.1 mg/dL — ABNORMAL LOW (ref 8.9–10.3)
Chloride: 109 mmol/L (ref 98–111)
Creatinine, Ser: 1.15 mg/dL (ref 0.61–1.24)
GFR, Estimated: >60 mL/min (ref >60)
Glucose, Bld: 108 mg/dL — ABNORMAL HIGH (ref 70–99)
Potassium: 3.9 mmol/L (ref 3.5–5.1)
Sodium: 140 mmol/L (ref 135–145)
Total Bilirubin: 1.5 mg/dL — ABNORMAL HIGH (ref 0.3–1.2)
Total Protein: 6 g/dL — ABNORMAL LOW (ref 6.5–8.1)

## 2022-01-16 LAB — GLUCOSE, CAPILLARY
Glucose-Capillary: 112 mg/dL — ABNORMAL HIGH (ref 70–99)
Glucose-Capillary: 202 mg/dL — ABNORMAL HIGH (ref 70–99)

## 2022-01-16 MED ORDER — OXYCODONE-ACETAMINOPHEN 5-325 MG PO TABS: 1.0000 | ORAL_TABLET | Freq: Four times a day (QID) | ORAL | 0 refills | Status: AC | PRN

## 2022-01-16 MED ORDER — AMOXICILLIN-POT CLAVULANATE 875-125 MG PO TABS: 1.0000 | ORAL_TABLET | Freq: Two times a day (BID) | ORAL | 0 refills | Status: AC

## 2022-01-16 MED ORDER — OXYCODONE HCL 5 MG PO TABS: 5.0000 mg | ORAL_TABLET | Freq: Four times a day (QID) | ORAL | 0 refills | Status: DC | PRN

## 2022-01-16 NOTE — Progress Notes (Signed)
Subjective/Chief Complaint: S/p drain, doing fine   Objective: Vital signs in last 24 hours: Temp:  [97.8 F (36.6 C)-98.9 F (37.2 C)] 98 F (36.7 C) (06/24 0721) Pulse Rate:  [61-74] 70 (06/23 2258) Resp:  [11-20] 14 (06/24 0721) BP: (112-195)/(72-114) 144/83 (06/24 0721) SpO2:  [93 %-100 %] 94 % (06/24 0721) Weight:  [77.5 kg] 77.5 kg (06/23 1211) Last BM Date : 01/14/22  Intake/Output from previous day: 06/23 0701 - 06/24 0700 In: 4089.7 [P.O.:600; I.V.:3384.7; IV Piggyback:100] Out: 1175 [Urine:1000; Drains:175] Intake/Output this shift: No intake/output data recorded.  Ab tender at drain site, functional  Lab Results:  Recent Labs    01/15/22 0402 01/16/22 0524  WBC 8.4 4.0  HGB 12.8* 11.6*  HCT 35.7* 33.0*  PLT 91* 63*   BMET Recent Labs    01/15/22 0402 01/16/22 0524  NA 142 140  K 3.4* 3.9  CL 113* 109  CO2 23 24  GLUCOSE 125* 108*  BUN 19 18  CREATININE 1.18 1.15  CALCIUM 8.4* 8.1*   PT/INR Recent Labs    01/14/22 2119 01/15/22 0402  LABPROT 15.5* 15.4*  INR 1.2 1.2   ABG No results for input(s): "PHART", "HCO3" in the last 72 hours.  Invalid input(s): "PCO2", "PO2"  Studies/Results: IR Perc Cholecystostomy  Result Date: 01/15/2022 INDICATION: 66 year old male referred for percutaneous cholecystostomy EXAM: CHOLECYSTOSTOMY MEDICATIONS: 15 mg IV Toradol postprocedure; 2 g Rocephin IV ANESTHESIA/SEDATION: Moderate (conscious) sedation was employed during this procedure. A total of Versed 4.0 mg and Fentanyl 100 mcg was administered intravenously. Moderate Sedation Time: 12 minutes. The patient's level of consciousness and vital signs were monitored continuously by radiology nursing throughout the procedure under my direct supervision. FLUOROSCOPY TIME:  Fluoroscopy Time: (8 mGy). COMPLICATIONS: None PROCEDURE: Informed written consent was obtained from the patient and the patient's family after a thorough discussion of the procedural  risks, benefits and alternatives. All questions were addressed. Maximal Sterile Barrier Technique was utilized including caps, mask, sterile gowns, sterile gloves, sterile drape, hand hygiene and skin antiseptic. A timeout was performed prior to the initiation of the procedure. Ultrasound survey of the right upper quadrant was performed for planning purposes. Once the patient is prepped and draped in the usual sterile fashion, the skin and subcutaneous tissues overlying the gallbladder were generously infiltrated 1% lidocaine for local anesthesia. A coaxial needle was advanced under ultrasound guidance through the skin subcutaneous tissues and a small segment of liver into the gallbladder lumen. With removal of the stylet, spontaneous dark bile drainage occurred. Using modified Seldinger technique, a 10 French drain was placed into the gallbladder fossa, with aspiration of the sample for the lab. Contrast injection confirmed position of the tube within the gallbladder lumen. Drainage catheter was attached to gravity drain with a suture retention placed. Patient tolerated the procedure well and remained hemodynamically stable throughout. No complications were encountered and no significant blood loss encountered. IMPRESSION: Status post percutaneous cholecystostomy Signed, Yvone Neu. Miachel Roux, RPVI [facility] Electronically Signed   By: Gilmer Mor D.O.   On: 01/15/2022 16:39   US Abdomen Limited RUQ (LIVER/GB)  Result Date: 01/14/2022 CLINICAL DATA:  Right upper quadrant pain. EXAM: ULTRASOUND ABDOMEN LIMITED RIGHT UPPER QUADRANT COMPARISON:  Nuclear medicine hepatobiliary scan 01/14/2022. CT abdomen and pelvis 12/05/2021. MRI abdomen 12/11/2020. ultrasound abdomen 09/30/2020. FINDINGS: Gallbladder: The gallbladder wall is thickened measuring up to 7.2 mm. Pericholecystic fluid is present. Sonographic Murphy sign is positive. There is sludge  throughout the gallbladder. Common bile duct: Diameter: 2.5 mm. Liver: The liver parenchyma is diffusely heterogeneous. There is an 8 x 8 x 9 mm hypoechoic area in the left lobe of the liver and in 11 by 10 x 11 mm hypoechoic area in the right lobe of the liver. Portal vein is patent on color Doppler imaging with normal direction of blood flow towards the liver. Other: None. IMPRESSION: 1. Gallbladder sludge with gallbladder wall thickening, pericholecystic fluid and positive sonographic Murphy sign. Findings are compatible with acute cholecystitis corresponding to recent hepatobiliary scan. 2. There are 2 hypoechoic lesions in the liver which are indeterminate measuring up to 11 mm. Recommend further evaluation with MRI. Electronically Signed   By: Darliss Cheney M.D.   On: 01/14/2022 17:56   NM Hepatobiliary Liver Func  Result Date: 01/14/2022 CLINICAL DATA:  Severe abdominal pain, nausea, and vomiting since early a.m. EXAM: NUCLEAR MEDICINE HEPATOBILIARY IMAGING TECHNIQUE: Sequential images of the abdomen were obtained out to 60 minutes following intravenous administration of radiopharmaceutical. RADIOPHARMACEUTICALS:  5.4 mCi Tc-56m  Choletec IV COMPARISON:  CT abdomen and pelvis 12/05/2021 FINDINGS: Normal tracer extraction from bloodstream indicating normal hepatocellular function. Prompt excretion of tracer into biliary tree. Small bowel visualized by 10 minutes. At 1 hour, gallbladder had not visualized. Patient then received 3 mg of morphine IV and imaging was continued for 30 minutes. Gallbladder failed to visualize following morphine augmentation. Findings are consistent with cystic duct obstruction and acute cholecystitis. IMPRESSION: Patent CBD. Nonvisualization of the gallbladder despite morphine augmentation consistent with cystic duct obstruction and acute cholecystitis. Electronically Signed   By: Ulyses Southward M.D.   On: 01/14/2022 16:25    Anti-infectives: Anti-infectives (From admission,  onward)    Start     Dose/Rate Route Frequency Ordered Stop   01/15/22 1700  cefTRIAXone (ROCEPHIN) 2 g in sodium chloride 0.9 % 100 mL IVPB        2 g 200 mL/hr over 30 Minutes Intravenous Every 24 hours 01/14/22 1808     01/14/22 1715  cefTRIAXone (ROCEPHIN) 2 g in sodium chloride 0.9 % 100 mL IVPB        2 g 200 mL/hr over 30 Minutes Intravenous  Once 01/14/22 1701 01/14/22 1744       Assessment/Plan: Cholecystitis s/p perc chole -will setup to see me in office -needs drain study setup -soft diet, can dc home from my standpoint on 10 days augmentin  I reviewed last 24 h vitals and pain scores, last 48 h intake and output, last 24 h labs and trends, and last 24 h imaging results.  This care required straight-forward level of medical decision making.    Bobby Gamble 01/16/2022

## 2022-01-18 ENCOUNTER — Other Ambulatory Visit: Payer: Self-pay | Admitting: General Surgery

## 2022-01-18 DIAGNOSIS — K810 Acute cholecystitis: Secondary | ICD-10-CM

## 2022-01-20 LAB — AEROBIC/ANAEROBIC CULTURE W GRAM STAIN (SURGICAL/DEEP WOUND)
Culture: NO GROWTH
Gram Stain: NONE SEEN

## 2022-02-03 ENCOUNTER — Emergency Department (HOSPITAL_COMMUNITY): Payer: Self-pay

## 2022-02-03 ENCOUNTER — Emergency Department (HOSPITAL_COMMUNITY)
Admission: EM | Admit: 2022-02-03 | Discharge: 2022-02-04 | Disposition: A | Discharge status: Home / Self Care | Payer: Self-pay | Attending: Student | Admitting: Student

## 2022-02-03 DIAGNOSIS — Z8546 Personal history of malignant neoplasm of prostate: Secondary | ICD-10-CM | POA: Insufficient documentation

## 2022-02-03 DIAGNOSIS — M545 Low back pain, unspecified: Secondary | ICD-10-CM | POA: Insufficient documentation

## 2022-02-03 DIAGNOSIS — I10 Essential (primary) hypertension: Secondary | ICD-10-CM | POA: Insufficient documentation

## 2022-02-03 DIAGNOSIS — Z79899 Other long term (current) drug therapy: Secondary | ICD-10-CM | POA: Insufficient documentation

## 2022-02-03 LAB — COMPREHENSIVE METABOLIC PANEL
ALT: 37 U/L (ref 0–44)
AST: 36 U/L (ref 15–41)
Albumin: 3.7 g/dL (ref 3.5–5.0)
Alkaline Phosphatase: 91 U/L (ref 38–126)
Anion gap: 9 (ref 5–15)
BUN: 21 mg/dL (ref 8–23)
CO2: 23 mmol/L (ref 22–32)
Calcium: 9.8 mg/dL (ref 8.9–10.3)
Chloride: 108 mmol/L (ref 98–111)
Creatinine, Ser: 1.27 mg/dL — ABNORMAL HIGH (ref 0.61–1.24)
GFR, Estimated: >60 mL/min (ref >60)
Glucose, Bld: 176 mg/dL — ABNORMAL HIGH (ref 70–99)
Potassium: 4.2 mmol/L (ref 3.5–5.1)
Sodium: 140 mmol/L (ref 135–145)
Total Bilirubin: 2 mg/dL — ABNORMAL HIGH (ref 0.3–1.2)
Total Protein: 7.5 g/dL (ref 6.5–8.1)

## 2022-02-03 LAB — CBC
HCT: 39.2 % (ref 39.0–52.0)
Hemoglobin: 14.2 g/dL (ref 13.0–17.0)
MCH: 34.8 pg — ABNORMAL HIGH (ref 26.0–34.0)
MCHC: 36.2 g/dL — ABNORMAL HIGH (ref 30.0–36.0)
MCV: 96.1 fL (ref 80.0–100.0)
Platelets: 109 10*3/uL — ABNORMAL LOW (ref 150–400)
RBC: 4.08 MIL/uL — ABNORMAL LOW (ref 4.22–5.81)
RDW: 13.9 % (ref 11.5–15.5)
WBC: 10.7 10*3/uL — ABNORMAL HIGH (ref 4.0–10.5)
nRBC: 0 % (ref 0.0–0.2)

## 2022-02-03 MED ORDER — HYDROMORPHONE HCL 2 MG/ML IJ SOLN
2.0000 mg | Freq: Once | INTRAMUSCULAR | Status: AC
  Administered 2022-02-03: 2 mg via INTRAVENOUS
  Filled 2022-02-03: qty 1

## 2022-02-03 MED ORDER — ACETAMINOPHEN 325 MG PO TABS
650.0000 mg | ORAL_TABLET | Freq: Once | ORAL | Status: AC
  Administered 2022-02-03: 650 mg via ORAL
  Filled 2022-02-03: qty 2

## 2022-02-03 MED ORDER — LIDOCAINE 5 % EX PTCH: 1.0000 | MEDICATED_PATCH | CUTANEOUS | 0 refills | Status: DC

## 2022-02-03 MED ORDER — ACETAMINOPHEN 500 MG PO TABS
1000.0000 mg | ORAL_TABLET | Freq: Once | ORAL | Status: DC
Start: 2022-02-03 — End: 2022-02-03

## 2022-02-03 MED ORDER — METHOCARBAMOL 500 MG PO TABS: 500.0000 mg | ORAL_TABLET | Freq: Two times a day (BID) | ORAL | 0 refills | Status: AC

## 2022-02-03 MED ORDER — METHOCARBAMOL 1000 MG/10ML IJ SOLN
1000.0000 mg | Freq: Once | INTRAMUSCULAR | Status: AC
  Administered 2022-02-03: 1000 mg via INTRAMUSCULAR
  Filled 2022-02-03: qty 10

## 2022-02-03 MED ORDER — HYDROMORPHONE HCL 1 MG/ML IJ SOLN
1.0000 mg | Freq: Once | INTRAMUSCULAR | Status: AC
  Administered 2022-02-03: 1 mg via INTRAMUSCULAR
  Filled 2022-02-03: qty 1

## 2022-02-03 NOTE — ED Provider Notes (Signed)
[place] DEPT Provider Note   CSN: [id number] Arrival date & time: 02/03/22  1153     History  Chief Complaint  Patient presents with   Back Pain    Bobby Gamble is a 66 y.o. male.  The history is provided by the patient and medical records.  Back Pain  66 y.o. M with hx of gout, HTN, prostate cancer, presenting to the ED with back pain.  Patient states last night he bent over to put a spoon in the dishwasher when he felt a pull in his back, dropped him to his knees.  Reports ongoing pain since that time.  Feels like his entire back is "locked up".  He states this morning he had a BM and while wiping he twisted to the right and further exacerbated his pain, now having increased pain in right shoulder as well.  He went to general surgery follow-up appointment today for recheck of his PERC drain, was referred to the ED due to his amount of pain.  He denies radiation of pain into the legs.  Denies any numbness or weakness of the lower extremities.  No bowel or bladder incontinence.  He does have oxycodone at home from his recent gallbladder surgery, however has not taken any.  He denies nausea, vomiting, diarrhea, fever, chills.  Home Medications Prior to Admission medications   Medication Sig Start Date End Date Taking? Authorizing Provider  ALPRAZolam Duanne Moron) 0.5 MG tablet Take 0.5 mg by mouth daily as needed for anxiety.    [provider]  Cholecalciferol (VITAMIN D3) 2000 UNITS TABS Take 4,000 Units by mouth every morning.    [provider]  Cinnamon 500 MG capsule Take 2,000 mg by mouth in the morning.    [provider]  esomeprazole (NEXIUM 24HR) 20 MG capsule Take 40 mg by mouth in the morning and at bedtime.    [provider]  fexofenadine (ALLEGRA) 180 MG tablet Take 180 mg by mouth daily.    [provider]  fluticasone (FLONASE) 50 MCG/ACT nasal spray Place 1 spray into both nostrils daily as  needed for allergies.    [provider]  hydrOXYzine (ATARAX/VISTARIL) 25 MG tablet Take 50 mg by mouth at bedtime. 08/08/19   [provider]  ibuprofen (ADVIL) 200 MG tablet Take 400 mg by mouth every 6 (six) hours as needed for mild pain or headache.    [provider]  losartan (COZAAR) 100 MG tablet Take 50 mg by mouth at bedtime. 04/13/20   [provider]  metoprolol succinate (TOPROL-XL) 25 MG 24 hr tablet Take 12.5 mg by mouth at bedtime. 11/23/21   [provider]  mupirocin ointment (BACTROBAN) 2 % Apply 1 application  topically daily as needed (for breakouts on the nose).    [provider]  Omega-3 Fatty Acids (FISH OIL) 1000 MG CAPS Take 1,000 mg by mouth daily with breakfast.    [provider]  sucralfate (CARAFATE) 1 g tablet Take 1 g by mouth 2 (two) times daily before a meal.    [provider]      Allergies    Oxycodone, Atorvastatin, Simvastatin, Codeine, and Hydrocodone    Review of Systems   Review of Systems  Musculoskeletal:  Positive for back pain.  All other systems reviewed and are negative.   Physical Exam Updated Vital Signs BP (!) 142/83   Pulse 84   Temp 99.6 F (37.6 C) (Oral)   Resp  20   SpO2 98%   Physical Exam Vitals and nursing note reviewed.  Constitutional:      Appearance: He is well-developed.  HENT:     Head: Normocephalic and atraumatic.  Eyes:     Conjunctiva/sclera: Conjunctivae normal.     Pupils: Pupils are equal, round, and reactive to light.  Cardiovascular:     Rate and Rhythm: Normal rate and regular rhythm.     Heart sounds: Normal heart sounds.  Pulmonary:     Effort: Pulmonary effort is normal. No respiratory distress.     Breath sounds: Normal breath sounds. No rhonchi.  Abdominal:     General: Bowel sounds are normal.     Palpations: Abdomen is soft.     Tenderness: There is no abdominal tenderness. There is no rebound.     Comments: Percutaneous  drain RUQ, minimal output, no signs of infection but does appear to have skin irritation around area of adhesive, no cellulitic changes, no tissue crepitus Abdomen soft, non-tender  Musculoskeletal:        General: Normal range of motion.     Cervical back: Normal range of motion.     Comments: Tenderness to right shoulder, some tenderness noted in anterior shoulder at junction of chest wall, no bony deformity, radial pulse intact Tender all throughout musculature of right thoracic and lumbar spine, no midline step-off or deformity Normal strength/sensation of both legs, no saddle anesthesia appreciated  Skin:    General: Skin is warm and dry.  Neurological:     Mental Status: He is alert and oriented to person, place, and time.     ED Results / Procedures / Treatments   Labs (all labs ordered are listed, but only abnormal results are displayed) Labs Reviewed  CBC - Abnormal; Notable for the following components:      Result Value   WBC 10.7 (*)    RBC 4.08 (*)    MCH 34.8 (*)    MCHC 36.2 (*)    Platelets 109 (*)    All other components within normal limits  COMPREHENSIVE METABOLIC PANEL - Abnormal; Notable for the following components:   Glucose, Bld 176 (*)    Creatinine, Ser 1.27 (*)    Total Bilirubin 2.0 (*)    All other components within normal limits  URINALYSIS, ROUTINE W REFLEX MICROSCOPIC    EKG None  Radiology CT Lumbar Spine Wo Contrast  Result Date: 02/03/2022 CLINICAL DATA:  Low back pain. EXAM: CT LUMBAR SPINE WITHOUT CONTRAST TECHNIQUE: Multidetector CT imaging of the lumbar spine was performed without intravenous contrast administration. Multiplanar CT image reconstructions were also generated. RADIATION DOSE REDUCTION: This exam was performed according to the departmental dose-optimization program which includes automated exposure control, adjustment of the mA and/or kV according to patient size and/or use of iterative reconstruction technique. COMPARISON:   None Available. FINDINGS: Segmentation: 5 lumbar type vertebrae. Alignment: 2 mm retrolisthesis of L3 on L4. 2 mm retrolisthesis of L4 on L5. Vertebrae: No acute fracture or aggressive osseous lesion. Paraspinal and other soft tissues: Abdominal aortic atherosclerosis. No acute paraspinal abnormality. Other: Osteoarthritis of bilateral SI joints with anterior bridging osteophyte of the left SI joint. Disc levels: Disc spaces: Degenerative disease with disc height loss at L3-4. T12-L1: No disc protrusion, foraminal stenosis or central canal stenosis. L1-L2: No disc protrusion, foraminal stenosis or central canal stenosis. L2-L3: Mild broad-based disc bulge. Mild bilateral facet arthropathy. No foraminal or central canal stenosis. L3-L4: Broad-based disc bulge flattening ventral thecal  sac. Mild spinal stenosis. No foraminal or central canal stenosis. L4-L5: Broad-based disc bulge flattening the ventral thecal sac. Mild bilateral facet arthropathy with ligamentum flavum infolding. Moderate spinal stenosis. No foraminal stenosis. L5-S1: Mild broad-based disc bulge. Mild bilateral foraminal stenosis. Severe right facet arthropathy. Mild left facet arthropathy. IMPRESSION: 1.  No acute osseous injury of the lumbar spine. 2. Lumbar spine spondylosis as described above. 3. Aortic Atherosclerosis (ICD10-I70.0). Electronically Signed   By: Kathreen Devoid M.D.   On: 02/03/2022 13:14   DG Shoulder Right  Result Date: 02/03/2022 CLINICAL DATA:  Right shoulder injury, right shoulder pain. Initial encounter. EXAM: RIGHT SHOULDER - 2+ VIEW COMPARISON:  None Available. FINDINGS: No acute osseous or joint abnormality. Right acromioclavicular joint osteoarthritis. Visualized right chest is unremarkable. IMPRESSION: Right acromioclavicular joint osteoarthritis. Electronically Signed   By: Lorin Picket M.D.   On: 02/03/2022 12:55    Procedures Procedures    Medications Ordered in ED Medications  HYDROmorphone (DILAUDID)  injection 1 mg (has no administration in time range)  methocarbamol (ROBAXIN) injection 1,000 mg (has no administration in time range)  acetaminophen (TYLENOL) tablet 650 mg (650 mg Oral Given 02/03/22 2204)    ED Course/ Medical Decision Making/ A&P                           Medical Decision Making Risk Prescription drug management.   66 year old male presenting to the ED with back pain after bending over to put his boot on the dishwasher yesterday.  And injured his right shoulder today after having bowel movement and trying to wipe.  States his back and shoulder feels like it is "locked up".  He is afebrile, nontoxic.  Reports diffuse pain throughout the back and right shoulder.  There is no midline deformity or step-off.  He has no focal neurologic deficits.  Denies any history of cancer or IV drug use.  No red flag symptoms on exam.  He had screening labs done in triage--minimal leukocytosis at 10.7, no electrolyte derangement.  X-ray of shoulder reviewed and is negative for any acute findings, does have AC osteoarthritis.  Also underwent CT of lumbar spine, no acute findings, does have spondylosis.  Results discussed with patient and wife.  Will attempt pain control.  As he does not have any focal neurologic deficits, do not feel he needs emergent MRI at this time.  11:25 PM Patient still with significant pain, now reporting worse in the pectoralis area and in posterior ribs.  Continues to feel "stiff".  He reports minimal relief with medications given.  Reports high tolerance to pain meds.  Discussed expectations with patient and wife, will likely not be pain free here in the ED but will try to make more comfortable.  Will give another dose of dilaudid.  Remains without focal deficits.  VSS.  Stable for discharge.  He has oxycodone at home already, encouraged to take this, will add robaxin and lidoderm patches.  Encouraged to follow-up  closely with PCP if not improving.  Return here for new  concerns.  Final Clinical Impression(s) / ED Diagnoses Final diagnoses:  Acute right-sided low back pain without sciatica    Rx / DC Orders ED Discharge Orders          Ordered    methocarbamol (ROBAXIN) 500 MG tablet  2 times daily        02/03/22 2355    lidocaine (LIDODERM) 5 %  Every 24 hours  02/03/22 2355              Larene Pickett, PA-C 02/04/22 0011    Teressa Lower, MD 02/05/22 1144

## 2022-02-03 NOTE — ED Triage Notes (Signed)
Pt states that last night he bent over and injured his lower back. States that earlier today he also started having R shoulder pain. Pt recently had surgery and has drain recently placed and states he still has output it, though it has been slowing down.

## 2022-02-03 NOTE — ED Provider Triage Note (Signed)
Emergency Medicine Provider Triage Evaluation Note  Bobby Gamble , a 66 y.o. male  was evaluated in triage.  Pt complains of back pain. He states that on Monday he bent over to fix something and felt a sudden pain in his lower back. States that same has been severe since then. He states that he has been able to have bowel movements but with pain, and yesterday when he went to have a bowel movement and when he wiped himself he injured his right shoulder. Denies any loss of bowel or bladder function or saddle paraesthesias. Does endorse sharp shooting pain down his right leg but no numbness or tingling.  Review of Systems  Positive:  Negative:  Physical Exam  BP (!) 141/81 (BP Location: Right Arm)   Pulse 80   Temp 98.3 F (36.8 C) (Oral)   Resp 18   SpO2 100%  Gen:   Awake, no distress in obvious discomfort Resp:  Normal effort  MSK:   Moves extremities without difficulty  Other:    Medical Decision Making  Medically screening exam initiated at 12:21 PM.  Appropriate orders placed.  Bobby Gamble was informed that the remainder of the evaluation will be completed by another provider, this initial triage assessment does not replace that evaluation, and the importance of remaining in the ED until their evaluation is complete.     Bobby Face, PA-C 02/03/22 1226

## 2022-02-03 NOTE — Discharge Instructions (Signed)
You can continue your oxycodone that you have at home.  I have sent additional meds to pharmacy.  You can use heat/ice as well. Follow-up with your primary care doctor. Return to the ED for new or worsening symptoms.

## 2022-02-03 NOTE — ED Notes (Signed)
Delay in lab draw. Patient in X-ray. 

## 2022-02-05 ENCOUNTER — Ambulatory Visit
Admission: RE | Admit: 2022-02-05 | Discharge: 2022-02-05 | Disposition: A | Discharge status: Home / Self Care | Payer: No Typology Code available for payment source | Source: Ambulatory Visit | Attending: General Surgery | Admitting: General Surgery

## 2022-02-05 ENCOUNTER — Ambulatory Visit
Admission: RE | Admit: 2022-02-05 | Discharge: 2022-02-05 | Disposition: A | Discharge status: Home / Self Care | Payer: No Typology Code available for payment source | Source: Ambulatory Visit | Attending: Student | Admitting: Student

## 2022-02-05 DIAGNOSIS — K810 Acute cholecystitis: Secondary | ICD-10-CM

## 2022-02-05 HISTORY — PX: IR RADIOLOGIST EVAL & MGMT: IMG5224

## 2022-02-05 NOTE — Progress Notes (Signed)
Patient ID: Bobby Gamble, male   DOB: Aug 25, 1955, 66 y.o.   MRN: [id number]       Chief Complaint:  Cholecystostomy, acute right upper quadrant pain  Referring Physician(s): Dr. Donne Hazel  History of Present Illness: Bobby Gamble is a 66 y.o. male status post cholecystostomy placement 01/15/2022.  Over the last few days, he has had escalated severe right upper quadrant pain at the drain catheter site.  He was recently seen at the surgeon's office who wanted him evaluated in the drain clinic as soon as possible.  He presents today with 3 to 4 days of severe right upper quadrant pain at the drain catheter site.  Minimal if any output.  Pain with flushing noted by his wife.  No fevers.  He has completed oral antibiotics.  Past Medical History:  Diagnosis Date   Anxiety 06/01/2011   tx. Xanax   Arthritis 06/01/2011   hx. Gout-tx. Allopurinol   Cancer ([place]) 06/01/2011   Bx.  with dx. 5 weeks ago-Prostate    Cirrhosis ([place])    GERD (gastroesophageal reflux disease) 06/01/2011   tx. Prilosec   History of colon polyps    Hyperlipidemia 06/01/2011   Hypertension    Seasonal allergies    Sinus disorder 06/01/2011   allergies chronic/ sinus issues    Past Surgical History:  Procedure Laterality Date   BIOPSY  01/05/2022   Procedure: BIOPSY;  Surgeon: Ronnette Juniper, MD;  Location: [facility];  Service: Gastroenterology;;   COLONOSCOPY  2020   Dr Earlean Shawl    ESOPHAGOGASTRODUODENOSCOPY (EGD) WITH PROPOFOL N/A 01/05/2022   Procedure: ESOPHAGOGASTRODUODENOSCOPY (EGD) WITH PROPOFOL;  Surgeon: Ronnette Juniper, MD;  Location: [facility];  Service: Gastroenterology;  Laterality: N/A;   IR PERC CHOLECYSTOSTOMY  01/15/2022   IR RADIOLOGIST EVAL & MGMT  02/05/2022   MANDIBLE FRACTURE SURGERY  06-01-11   Rt. lower jaw-retained hardware.   ROBOT ASSISTED LAPAROSCOPIC RADICAL PROSTATECTOMY  06/09/2011   Procedure: ROBOTIC ASSISTED LAPAROSCOPIC RADICAL PROSTATECTOMY;  Surgeon: Bernestine Amass,  MD;  Location: [facility];  Service: Urology;  Laterality: Bilateral;  with Bilateral Lymph Node Dessection   WISDOM TOOTH EXTRACTION  06-01-11   all    Allergies: Oxycodone, Atorvastatin, Simvastatin, Codeine, and Hydrocodone  Medications: Prior to Admission medications   Medication Sig Start Date End Date Taking? Authorizing Provider  ALPRAZolam Duanne Moron) 0.5 MG tablet Take 0.5 mg by mouth daily as needed for anxiety.    [provider]  Cholecalciferol (VITAMIN D3) 2000 UNITS TABS Take 4,000 Units by mouth every morning.    [provider]  Cinnamon 500 MG capsule Take 2,000 mg by mouth in the morning.    [provider]  esomeprazole (NEXIUM 24HR) 20 MG capsule Take 40 mg by mouth in the morning and at bedtime.    [provider]  fexofenadine (ALLEGRA) 180 MG tablet Take 180 mg by mouth daily.    [provider]  fluticasone (FLONASE) 50 MCG/ACT nasal spray Place 1 spray into both nostrils daily as needed for allergies.    [provider]  hydrOXYzine (ATARAX/VISTARIL) 25 MG tablet Take 50 mg by mouth at bedtime. 08/08/19   [provider]  ibuprofen (ADVIL) 200 MG tablet Take 400 mg by mouth every 6 (six) hours as needed for mild pain or headache.    [provider]  lidocaine (LIDODERM) 5 % Place 1 patch onto the skin daily. Remove & Discard patch within 12 hours or as directed by MD 02/03/22  Larene Pickett, PA-C  losartan (COZAAR) 100 MG tablet Take 50 mg by mouth at bedtime. 04/13/20   [provider]  methocarbamol (ROBAXIN) 500 MG tablet Take 1 tablet (500 mg total) by mouth 2 (two) times daily. 02/03/22   Larene Pickett, PA-C  metoprolol succinate (TOPROL-XL) 25 MG 24 hr tablet Take 12.5 mg by mouth at bedtime. 11/23/21   [provider]  mupirocin ointment (BACTROBAN) 2 % Apply 1 application  topically daily as needed (for breakouts on the nose).    [provider]  Omega-3 Fatty Acids (FISH  OIL) 1000 MG CAPS Take 1,000 mg by mouth daily with breakfast.    [provider]  sucralfate (CARAFATE) 1 g tablet Take 1 g by mouth 2 (two) times daily before a meal.    [provider]     Family History  Problem Relation Age of Onset   Liver disease Father        unsure of what type   Esophageal cancer Neg Hx    Colon cancer Neg Hx    Pancreatic cancer Neg Hx    Pancreatic disease Neg Hx    Colon polyps Neg Hx    Prostate cancer Neg Hx    Rectal cancer Neg Hx    Stomach cancer Neg Hx     Social History   Socioeconomic History   Marital status: Married    Spouse name: Not on file   Number of children: Not on file   Years of education: Not on file   Highest education level: Not on file  Occupational History   Not on file  Tobacco Use   Smoking status: Former   Smokeless tobacco: Never  Vaping Use   Vaping Use: Never used  Substance and Sexual Activity   Alcohol use: Not Currently    Alcohol/week: 4.0 standard drinks of alcohol    Types: 4 Shots of liquor per week   Drug use: No   Sexual activity: Yes  Other Topics Concern   Not on file  Social History Narrative   Not on file   Social Determinants of Health   Financial Resource Strain: Not on file  Food Insecurity: Not on file  Transportation Needs: Not on file  Physical Activity: Not on file  Stress: Not on file  Social Connections: Not on file      Review of Systems: A 12 point ROS discussed and pertinent positives are indicated in the HPI above.  All other systems are negative.  Review of Systems  Vital Signs: BP (!) 141/85   Pulse 91   Temp 98.8 F (37.1 C)   SpO2 94%     Physical Exam Constitutional:      Appearance: He is normal weight.     Comments: Anxious appearing male with right upper quadrant pain with any drain manipulation  Eyes:     General: No scleral icterus.    Conjunctiva/sclera: Conjunctivae normal.  Abdominal:     Palpations: Abdomen is soft.      Tenderness: There is abdominal tenderness. There is guarding.     Comments: Drain site is clean, dry and intact.  Minimal redness and irritation from the adhesive  Skin:    Coloration: Skin is not jaundiced.  Neurological:     General: No focal deficit present.     Mental Status: He is alert. Mental status is at baseline.       Imaging: DG Sinus/Fist Tube Chk-Non GI  Result Date:  02/05/2022 INDICATION: Cholecystostomy, right upper quadrant pain EXAM: FLUOROSCOPIC INJECTION OF THE EXISTING CHOLECYSTOSTOMY MEDICATIONS: patient is on outpatient antibiotics ANESTHESIA/SEDATION: None. COMPLICATIONS: None immediate. PROCEDURE: Under sterile conditions, the existing cholecystostomy was injected with contrast under fluoroscopy. Images obtained. Cholecystostomy has retracted to the liver margin peripherally and is within a small superficial hepatic tract. Catheter is no longer in the gallbladder. Limited ultrasound performed of the right upper quadrant. Gallbladder is collapsed. Drain catheter is along the peripheral liver margin/liver capsule outside the gallbladder. Therefore, the catheter was cut and removed without difficulty. IMPRESSION: Cholecystostomy has retracted into the peripheral liver margin outside the gallbladder. Limited ultrasound confirms the gallbladder is nondistended/collapsed. Therefore the malpositioned cholecystostomy was cut and removed. These results were called by telephone at the time of interpretation on 02/05/2022 at 2:22 pm to provider MATTHEW WAKEFIELD , who verbally acknowledged these results. Electronically Signed   By: Jerilynn Mages.  Itsel Opfer M.D.   On: 02/05/2022 14:23   IR Radiologist Eval & Mgmt  Result Date: 02/05/2022 Please refer to notes tab for details about interventional procedure. (Op Note)  CT Lumbar Spine Wo Contrast  Result Date: 02/03/2022 CLINICAL DATA:  Low back pain. EXAM: CT LUMBAR SPINE WITHOUT CONTRAST TECHNIQUE: Multidetector CT imaging of the lumbar spine was  performed without intravenous contrast administration. Multiplanar CT image reconstructions were also generated. RADIATION DOSE REDUCTION: This exam was performed according to the departmental dose-optimization program which includes automated exposure control, adjustment of the mA and/or kV according to patient size and/or use of iterative reconstruction technique. COMPARISON:  None Available. FINDINGS: Segmentation: 5 lumbar type vertebrae. Alignment: 2 mm retrolisthesis of L3 on L4. 2 mm retrolisthesis of L4 on L5. Vertebrae: No acute fracture or aggressive osseous lesion. Paraspinal and other soft tissues: Abdominal aortic atherosclerosis. No acute paraspinal abnormality. Other: Osteoarthritis of bilateral SI joints with anterior bridging osteophyte of the left SI joint. Disc levels: Disc spaces: Degenerative disease with disc height loss at L3-4. T12-L1: No disc protrusion, foraminal stenosis or central canal stenosis. L1-L2: No disc protrusion, foraminal stenosis or central canal stenosis. L2-L3: Mild broad-based disc bulge. Mild bilateral facet arthropathy. No foraminal or central canal stenosis. L3-L4: Broad-based disc bulge flattening ventral thecal sac. Mild spinal stenosis. No foraminal or central canal stenosis. L4-L5: Broad-based disc bulge flattening the ventral thecal sac. Mild bilateral facet arthropathy with ligamentum flavum infolding. Moderate spinal stenosis. No foraminal stenosis. L5-S1: Mild broad-based disc bulge. Mild bilateral foraminal stenosis. Severe right facet arthropathy. Mild left facet arthropathy. IMPRESSION: 1.  No acute osseous injury of the lumbar spine. 2. Lumbar spine spondylosis as described above. 3. Aortic Atherosclerosis (ICD10-I70.0). Electronically Signed   By: Kathreen Devoid M.D.   On: 02/03/2022 13:14   DG Shoulder Right  Result Date: 02/03/2022 CLINICAL DATA:  Right shoulder injury, right shoulder pain. Initial encounter. EXAM: RIGHT SHOULDER - 2+ VIEW COMPARISON:   None Available. FINDINGS: No acute osseous or joint abnormality. Right acromioclavicular joint osteoarthritis. Visualized right chest is unremarkable. IMPRESSION: Right acromioclavicular joint osteoarthritis. Electronically Signed   By: Lorin Picket M.D.   On: 02/03/2022 12:55   IR Perc Cholecystostomy  Result Date: 01/15/2022 INDICATION: 66 year old male referred for percutaneous cholecystostomy EXAM: CHOLECYSTOSTOMY MEDICATIONS: 15 mg IV Toradol postprocedure; 2 g Rocephin IV ANESTHESIA/SEDATION: Moderate (conscious) sedation was employed during this procedure. A total of Versed 4.0 mg and Fentanyl 100 mcg was administered intravenously. Moderate Sedation Time: 12 minutes. The patient's level of consciousness and vital signs were monitored continuously by radiology nursing throughout the  procedure under my direct supervision. FLUOROSCOPY TIME:  Fluoroscopy Time: (8 mGy). COMPLICATIONS: None PROCEDURE: Informed written consent was obtained from the patient and the patient's family after a thorough discussion of the procedural risks, benefits and alternatives. All questions were addressed. Maximal Sterile Barrier Technique was utilized including caps, mask, sterile gowns, sterile gloves, sterile drape, hand hygiene and skin antiseptic. A timeout was performed prior to the initiation of the procedure. Ultrasound survey of the right upper quadrant was performed for planning purposes. Once the patient is prepped and draped in the usual sterile fashion, the skin and subcutaneous tissues overlying the gallbladder were generously infiltrated 1% lidocaine for local anesthesia. A coaxial needle was advanced under ultrasound guidance through the skin subcutaneous tissues and a small segment of liver into the gallbladder lumen. With removal of the stylet, spontaneous dark bile drainage occurred. Using modified Seldinger technique, a 10 French drain was placed into the gallbladder fossa, with aspiration of the sample  for the lab. Contrast injection confirmed position of the tube within the gallbladder lumen. Drainage catheter was attached to gravity drain with a suture retention placed. Patient tolerated the procedure well and remained hemodynamically stable throughout. No complications were encountered and no significant blood loss encountered. IMPRESSION: Status post percutaneous cholecystostomy Signed, Dulcy Fanny. Nadene Rubins, RPVI [facility] Electronically Signed   By: Corrie Mckusick D.O.   On: 01/15/2022 16:39   US Abdomen Limited RUQ (LIVER/GB)  Result Date: 01/14/2022 CLINICAL DATA:  Right upper quadrant pain. EXAM: ULTRASOUND ABDOMEN LIMITED RIGHT UPPER QUADRANT COMPARISON:  Nuclear medicine hepatobiliary scan 01/14/2022. CT abdomen and pelvis 12/05/2021. MRI abdomen 12/11/2020. ultrasound abdomen 09/30/2020. FINDINGS: Gallbladder: The gallbladder wall is thickened measuring up to 7.2 mm. Pericholecystic fluid is present. Sonographic Murphy sign is positive. There is sludge throughout the gallbladder. Common bile duct: Diameter: 2.5 mm. Liver: The liver parenchyma is diffusely heterogeneous. There is an 8 x 8 x 9 mm hypoechoic area in the left lobe of the liver and in 11 by 10 x 11 mm hypoechoic area in the right lobe of the liver. Portal vein is patent on color Doppler imaging with normal direction of blood flow towards the liver. Other: None. IMPRESSION: 1. Gallbladder sludge with gallbladder wall thickening, pericholecystic fluid and positive sonographic Murphy sign. Findings are compatible with acute cholecystitis corresponding to recent hepatobiliary scan. 2. There are 2 hypoechoic lesions in the liver which are indeterminate measuring up to 11 mm. Recommend further evaluation with MRI. Electronically Signed   By: Ronney Asters M.D.   On: 01/14/2022 17:56   NM Hepatobiliary Liver Func  Result Date: 01/14/2022 CLINICAL DATA:  Severe abdominal pain,  nausea, and vomiting since early a.m. EXAM: NUCLEAR MEDICINE HEPATOBILIARY IMAGING TECHNIQUE: Sequential images of the abdomen were obtained out to 60 minutes following intravenous administration of radiopharmaceutical. RADIOPHARMACEUTICALS:  5.4 mCi Tc-58m Choletec IV COMPARISON:  CT abdomen and pelvis 12/05/2021 FINDINGS: Normal tracer extraction from bloodstream indicating normal hepatocellular function. Prompt excretion of tracer into biliary tree. Small bowel visualized by 10 minutes. At 1 hour, gallbladder had not visualized. Patient then received 3 mg of morphine IV and imaging was continued for 30 minutes. Gallbladder failed to visualize following morphine augmentation. Findings are consistent with cystic duct obstruction and acute cholecystitis. IMPRESSION: Patent CBD. Nonvisualization of the gallbladder despite morphine augmentation consistent with cystic duct obstruction and acute cholecystitis. Electronically Signed   By: MLavonia DanaM.D.   On: 01/14/2022 16:25  Labs:  CBC: Recent Labs    01/14/22 1125 01/15/22 0402 01/16/22 0524 02/03/22 1302  WBC 4.9 8.4 4.0 10.7*  HGB 14.4 12.8* 11.6* 14.2  HCT 39.2 35.7* 33.0* 39.2  PLT 92* 91* 63* 109*    COAGS: Recent Labs    12/05/21 1618 01/14/22 2119 01/15/22 0402  INR 1.1 1.2 1.2    BMP: Recent Labs    01/14/22 1125 01/15/22 0402 01/16/22 0524 02/03/22 1302  NA 136 142 140 140  K 3.9 3.4* 3.9 4.2  CL 105 113* 109 108  CO2 19* '23 24 23  '$ GLUCOSE 269* 125* 108* 176*  BUN '16 19 18 21  '$ CALCIUM 9.5 8.4* 8.1* 9.8  CREATININE 1.07 1.18 1.15 1.27*  GFRNONAA >60 >60 >60 >60    LIVER FUNCTION TESTS: Recent Labs    01/14/22 1125 01/15/22 0402 01/16/22 0524 02/03/22 1302  BILITOT 1.3* 1.5* 1.5* 2.0*  AST 44* 32 33 36  ALT 39 30 31 37  ALKPHOS 139* 70 64 91  PROT 7.6 6.3* 6.0* 7.5  ALBUMIN 3.8 3.2* 3.1* 3.7      Assessment and Plan:  Injection of the cholecystostomy confirms retraction into the peripheral  percutaneous liver along the liver capsule.  Limited ultrasound confirms that the gallbladder is collapsed/nondistended.  Therefore, the drain catheter was cut and removed.  5 discussed with Dr. Donne Hazel.  Plan: Outpatient surgical follow-up with Dr. Donne Hazel.  Dr. Donne Hazel also plans to start him on oral antibiotics and see him in the next 1 to 2 weeks.  He was counseled on recurrent symptoms of cholecystitis and what to watch out for.  If these symptoms recur, he knows to come to the emergency room.     Electronically Signed: Greggory Keen 02/05/2022, 2:57 PM   I spent a total of    40 Minutes in face to face in clinical consultation, greater than 50% of which was counseling/coordinating care for this patient with malpositioned cholecystostomy

## 2022-02-17 ENCOUNTER — Emergency Department (HOSPITAL_COMMUNITY): Payer: Self-pay

## 2022-02-17 ENCOUNTER — Other Ambulatory Visit: Payer: Self-pay

## 2022-02-17 ENCOUNTER — Encounter (HOSPITAL_COMMUNITY): Payer: Self-pay

## 2022-02-17 ENCOUNTER — Observation Stay (HOSPITAL_COMMUNITY)
Admission: EM | Admit: 2022-02-17 | Discharge: 2022-02-18 | Disposition: A | Discharge status: Home / Self Care | Payer: Self-pay | Attending: Surgery | Admitting: Surgery

## 2022-02-17 DIAGNOSIS — I10 Essential (primary) hypertension: Secondary | ICD-10-CM | POA: Insufficient documentation

## 2022-02-17 DIAGNOSIS — K810 Acute cholecystitis: Secondary | ICD-10-CM | POA: Diagnosis present

## 2022-02-17 DIAGNOSIS — K819 Cholecystitis, unspecified: Secondary | ICD-10-CM

## 2022-02-17 DIAGNOSIS — Z87891 Personal history of nicotine dependence: Secondary | ICD-10-CM | POA: Insufficient documentation

## 2022-02-17 DIAGNOSIS — R110 Nausea: Secondary | ICD-10-CM | POA: Insufficient documentation

## 2022-02-17 DIAGNOSIS — K812 Acute cholecystitis with chronic cholecystitis: Principal | ICD-10-CM | POA: Insufficient documentation

## 2022-02-17 DIAGNOSIS — Z8546 Personal history of malignant neoplasm of prostate: Secondary | ICD-10-CM | POA: Insufficient documentation

## 2022-02-17 DIAGNOSIS — Z79899 Other long term (current) drug therapy: Secondary | ICD-10-CM | POA: Insufficient documentation

## 2022-02-17 LAB — COMPREHENSIVE METABOLIC PANEL
ALT: 37 U/L (ref 0–44)
AST: 39 U/L (ref 15–41)
Albumin: 3.4 g/dL — ABNORMAL LOW (ref 3.5–5.0)
Alkaline Phosphatase: 106 U/L (ref 38–126)
Anion gap: 9 (ref 5–15)
BUN: 16 mg/dL (ref 8–23)
CO2: 24 mmol/L (ref 22–32)
Calcium: 9.8 mg/dL (ref 8.9–10.3)
Chloride: 107 mmol/L (ref 98–111)
Creatinine, Ser: 1.03 mg/dL (ref 0.61–1.24)
GFR, Estimated: >60 mL/min (ref >60)
Glucose, Bld: 212 mg/dL — ABNORMAL HIGH (ref 70–99)
Potassium: 4.3 mmol/L (ref 3.5–5.1)
Sodium: 140 mmol/L (ref 135–145)
Total Bilirubin: 1.1 mg/dL (ref 0.3–1.2)
Total Protein: 7.5 g/dL (ref 6.5–8.1)

## 2022-02-17 LAB — CBC WITH DIFFERENTIAL/PLATELET
Abs Immature Granulocytes: 0.05 10*3/uL (ref 0.00–0.07)
Basophils Absolute: 0.1 10*3/uL (ref 0.0–0.1)
Basophils Relative: 1 %
Eosinophils Absolute: 0.2 10*3/uL (ref 0.0–0.5)
Eosinophils Relative: 2 %
HCT: 37.9 % — ABNORMAL LOW (ref 39.0–52.0)
Hemoglobin: 14 g/dL (ref 13.0–17.0)
Immature Granulocytes: 1 %
Lymphocytes Relative: 17 %
Lymphs Abs: 1.6 10*3/uL (ref 0.7–4.0)
MCH: 34.5 pg — ABNORMAL HIGH (ref 26.0–34.0)
MCHC: 36.9 g/dL — ABNORMAL HIGH (ref 30.0–36.0)
MCV: 93.3 fL (ref 80.0–100.0)
Monocytes Absolute: 0.6 10*3/uL (ref 0.1–1.0)
Monocytes Relative: 6 %
Neutro Abs: 6.8 10*3/uL (ref 1.7–7.7)
Neutrophils Relative %: 73 %
Platelets: 126 10*3/uL — ABNORMAL LOW (ref 150–400)
RBC: 4.06 MIL/uL — ABNORMAL LOW (ref 4.22–5.81)
RDW: 12.8 % (ref 11.5–15.5)
WBC: 9.2 10*3/uL (ref 4.0–10.5)
nRBC: 0 % (ref 0.0–0.2)

## 2022-02-17 LAB — CREATININE, SERUM
Creatinine, Ser: 1.12 mg/dL (ref 0.61–1.24)
GFR, Estimated: >60 mL/min (ref >60)

## 2022-02-17 LAB — PROTIME-INR
INR: 1.2 (ref 0.8–1.2)
Prothrombin Time: 15.2 seconds (ref 11.4–15.2)

## 2022-02-17 LAB — CBC
HCT: 35.6 % — ABNORMAL LOW (ref 39.0–52.0)
Hemoglobin: 12.8 g/dL — ABNORMAL LOW (ref 13.0–17.0)
MCH: 33.8 pg (ref 26.0–34.0)
MCHC: 36 g/dL (ref 30.0–36.0)
MCV: 93.9 fL (ref 80.0–100.0)
Platelets: 118 10*3/uL — ABNORMAL LOW (ref 150–400)
RBC: 3.79 MIL/uL — ABNORMAL LOW (ref 4.22–5.81)
RDW: 13 % (ref 11.5–15.5)
WBC: 8.6 10*3/uL (ref 4.0–10.5)
nRBC: 0 % (ref 0.0–0.2)

## 2022-02-17 MED ORDER — METOPROLOL SUCCINATE ER 25 MG PO TB24
12.5000 mg | ORAL_TABLET | Freq: Every day | ORAL | Status: DC
Start: 2022-02-17 — End: 2022-02-18
  Administered 2022-02-17: 12.5 mg via ORAL
  Filled 2022-02-17: qty 1

## 2022-02-17 MED ORDER — POLYETHYLENE GLYCOL 3350 17 G PO PACK: 17.0000 g | PACK | Freq: Every day | ORAL | Status: DC | PRN

## 2022-02-17 MED ORDER — PANTOPRAZOLE SODIUM 40 MG IV SOLR
40.0000 mg | Freq: Every day | INTRAVENOUS | Status: DC
Start: 2022-02-17 — End: 2022-02-18
  Administered 2022-02-17: 40 mg via INTRAVENOUS
  Filled 2022-02-17: qty 10

## 2022-02-17 MED ORDER — PIPERACILLIN-TAZOBACTAM 3.375 G IVPB
3.3750 g | Freq: Three times a day (TID) | INTRAVENOUS | Status: DC
  Administered 2022-02-17 – 2022-02-18 (×3): 3.375 g via INTRAVENOUS
  Filled 2022-02-17 (×3): qty 50

## 2022-02-17 MED ORDER — ALPRAZOLAM 0.5 MG PO TABS: 0.5000 mg | ORAL_TABLET | Freq: Every day | ORAL | Status: DC | PRN

## 2022-02-17 MED ORDER — SUCRALFATE 1 G PO TABS
1.0000 g | ORAL_TABLET | Freq: Two times a day (BID) | ORAL | Status: DC
  Administered 2022-02-17 – 2022-02-18 (×2): 1 g via ORAL
  Filled 2022-02-17 (×3): qty 1

## 2022-02-17 MED ORDER — HYDROMORPHONE HCL 1 MG/ML IJ SOLN
0.5000 mg | INTRAMUSCULAR | Status: DC | PRN
  Administered 2022-02-17 (×2): 0.5 mg via INTRAVENOUS
  Filled 2022-02-17 (×3): qty 0.5

## 2022-02-17 MED ORDER — ENOXAPARIN SODIUM 40 MG/0.4ML IJ SOSY
40.0000 mg | PREFILLED_SYRINGE | INTRAMUSCULAR | Status: DC
  Filled 2022-02-17: qty 0.4

## 2022-02-17 MED ORDER — LOSARTAN POTASSIUM 25 MG PO TABS
50.0000 mg | ORAL_TABLET | Freq: Every day | ORAL | Status: DC
  Administered 2022-02-17: 50 mg via ORAL
  Filled 2022-02-17: qty 2

## 2022-02-17 MED ORDER — ONDANSETRON 4 MG PO TBDP: 4.0000 mg | ORAL_TABLET | Freq: Four times a day (QID) | ORAL | Status: DC | PRN

## 2022-02-17 MED ORDER — OXYCODONE HCL 5 MG PO TABS
5.0000 mg | ORAL_TABLET | ORAL | Status: DC | PRN
  Administered 2022-02-17: 10 mg via ORAL
  Filled 2022-02-17: qty 2

## 2022-02-17 MED ORDER — HYDROXYZINE HCL 25 MG PO TABS
50.0000 mg | ORAL_TABLET | Freq: Every day | ORAL | Status: DC
  Administered 2022-02-17: 50 mg via ORAL
  Filled 2022-02-17: qty 2

## 2022-02-17 MED ORDER — MORPHINE SULFATE (PF) 4 MG/ML IV SOLN
4.0000 mg | Freq: Once | INTRAVENOUS | Status: AC
  Administered 2022-02-17: 4 mg via INTRAVENOUS
  Filled 2022-02-17: qty 1

## 2022-02-17 MED ORDER — SODIUM CHLORIDE 0.9 % IV SOLN
INTRAVENOUS | Status: DC
Start: 2022-02-17 — End: 2022-02-18

## 2022-02-17 MED ORDER — METOPROLOL TARTRATE 5 MG/5ML IV SOLN: 5.0000 mg | Freq: Four times a day (QID) | INTRAVENOUS | Status: DC | PRN

## 2022-02-17 MED ORDER — ENOXAPARIN SODIUM 40 MG/0.4ML IJ SOSY: 40.0000 mg | PREFILLED_SYRINGE | INTRAMUSCULAR | Status: DC

## 2022-02-17 MED ORDER — ACETAMINOPHEN 500 MG PO TABS
1000.0000 mg | ORAL_TABLET | Freq: Four times a day (QID) | ORAL | Status: DC
Start: 2022-02-17 — End: 2022-02-18
  Administered 2022-02-17 – 2022-02-18 (×2): 1000 mg via ORAL
  Filled 2022-02-17 (×3): qty 2

## 2022-02-17 MED ORDER — FLUTICASONE PROPIONATE 50 MCG/ACT NA SUSP
2.0000 | Freq: Every day | NASAL | Status: DC | PRN
Start: 2022-02-17 — End: 2022-02-18

## 2022-02-17 MED ORDER — ONDANSETRON HCL 4 MG/2ML IJ SOLN
4.0000 mg | Freq: Once | INTRAMUSCULAR | Status: AC
Start: 2022-02-17 — End: 2022-02-17
  Administered 2022-02-17: 4 mg via INTRAVENOUS
  Filled 2022-02-17: qty 2

## 2022-02-17 MED ORDER — VITAMIN D3 25 MCG (1000 UNIT) PO TABS
4000.0000 [IU] | ORAL_TABLET | Freq: Every day | ORAL | Status: DC
  Administered 2022-02-18: 4000 [IU] via ORAL
  Filled 2022-02-17: qty 4

## 2022-02-17 MED ORDER — ONDANSETRON HCL 4 MG/2ML IJ SOLN: 4.0000 mg | Freq: Four times a day (QID) | INTRAMUSCULAR | Status: DC | PRN

## 2022-02-17 MED ORDER — LORATADINE 10 MG PO TABS
10.0000 mg | ORAL_TABLET | Freq: Every day | ORAL | Status: DC
  Administered 2022-02-18: 10 mg via ORAL
  Filled 2022-02-17: qty 1

## 2022-02-17 NOTE — ED Provider Notes (Signed)
[facility] DEPT Provider Note   CSN: [id number] Arrival date & time: 02/17/22  0753     History  Chief Complaint  Patient presents with   Abdominal Pain   Nausea    Bobby Gamble is a 66 y.o. male.  Patient is a 66 year old male with a past medical history of hypertension, prior prostate cancer status post prostatectomy and Coley lithiasis presenting to the emergency department with concern for "gallbladder attack".  The patient states that he recently had cholecystitis that was treated with a PERC drain that was removed on 7/14 followed by a course of antibiotics that he completed yesterday.  He states that he woke up around 4 AM this morning with right upper quadrant pain associated with nausea and vomiting.  He states that he had 1 Percocet left from his PERC drain which he took and did somewhat relieve the pain.  He states that his pain has been relatively well controlled the last few weeks and that he was initially scheduled for cholecystectomy next Tuesday.  He states that he had some hot and cold spells at home but no measured fevers.  He denies any diarrhea or constipation, dysuria or hematuria.  The history is provided by the patient and the spouse.  Abdominal Pain      Home Medications Prior to Admission medications   Medication Sig Start Date End Date Taking? Authorizing Provider  ALPRAZolam Duanne Moron) 0.5 MG tablet Take 0.5 mg by mouth daily as needed for anxiety.    [provider]  Cholecalciferol (VITAMIN D3) 2000 UNITS TABS Take 4,000 Units by mouth every morning.    [provider]  Cinnamon 500 MG capsule Take 2,000 mg by mouth in the morning.    [provider]  esomeprazole (NEXIUM) 20 MG capsule Take 40 mg by mouth in the morning and at bedtime.    [provider]  fexofenadine (ALLEGRA) 180 MG tablet Take 180 mg by mouth daily.    [provider]  fluticasone (FLONASE) 50 MCG/ACT  nasal spray Place 2 sprays into both nostrils daily as needed for allergies.    [provider]  hydrOXYzine (ATARAX/VISTARIL) 25 MG tablet Take 50 mg by mouth at bedtime. 08/08/19   [provider]  ibuprofen (ADVIL) 200 MG tablet Take 400 mg by mouth every 6 (six) hours as needed for mild pain or headache.    [provider]  lidocaine (LIDODERM) 5 % Place 1 patch onto the skin daily. Remove & Discard patch within 12 hours or as directed by MD Patient not taking: Reported on 02/15/2022 02/03/22   Larene Pickett, PA-C  losartan (COZAAR) 100 MG tablet Take 50 mg by mouth at bedtime. 04/13/20   [provider]  methocarbamol (ROBAXIN) 500 MG tablet Take 1 tablet (500 mg total) by mouth 2 (two) times daily. Patient taking differently: Take 500 mg by mouth every 8 (eight) hours as needed for muscle spasms. 02/03/22   Larene Pickett, PA-C  metoprolol succinate (TOPROL-XL) 25 MG 24 hr tablet Take 12.5 mg by mouth at bedtime. 11/23/21   [provider]  mupirocin ointment (BACTROBAN) 2 % Apply 1 application  topically daily as needed (for breakouts on the nose).    [provider]  Omega-3 Fatty Acids (FISH OIL) 1200 MG CAPS Take 1,200 mg by mouth daily with breakfast.    [provider]  oxyCODONE-acetaminophen (PERCOCET/ROXICET) 5-325 MG tablet Take 1 tablet by mouth daily as needed for  severe pain.    [provider]  sucralfate (CARAFATE) 1 g tablet Take 1 g by mouth 2 (two) times daily before a meal.    [provider]      Allergies    Atorvastatin, Simvastatin, Codeine, and Hydrocodone    Review of Systems   Review of Systems  Gastrointestinal:  Positive for abdominal pain.    Physical Exam Updated Vital Signs BP (!) 165/98   Pulse 82   Temp 98.1 F (36.7 C) (Oral)   Resp 18   Ht '5\' 7"'$  (1.702 m)   Wt 72.6 kg   SpO2 98%   BMI 25.06 kg/m  Physical Exam Constitutional:      Comments: Mildly uncomfortable  appearing  HENT:     Head: Normocephalic and atraumatic.     Mouth/Throat:     Mouth: Mucous membranes are moist.  Eyes:     Extraocular Movements: Extraocular movements intact.  Cardiovascular:     Rate and Rhythm: Normal rate and regular rhythm.  Pulmonary:     Effort: Pulmonary effort is normal.     Breath sounds: Normal breath sounds.  Abdominal:     General: Abdomen is flat.     Palpations: Abdomen is soft.     Tenderness: There is abdominal tenderness in the right upper quadrant. There is no right CVA tenderness, left CVA tenderness, guarding or rebound.  Skin:    General: Skin is warm and dry.  Neurological:     General: No focal deficit present.     Mental Status: He is alert and oriented to person, place, and time.  Psychiatric:        Mood and Affect: Mood normal.     ED Results / Procedures / Treatments   Labs (all labs ordered are listed, but only abnormal results are displayed) Labs Reviewed  CBC WITH DIFFERENTIAL/PLATELET - Abnormal; Notable for the following components:      Result Value   RBC 4.06 (*)    HCT 37.9 (*)    MCH 34.5 (*)    MCHC 36.9 (*)    Platelets 126 (*)    All other components within normal limits  COMPREHENSIVE METABOLIC PANEL - Abnormal; Notable for the following components:   Glucose, Bld 212 (*)    Albumin 3.4 (*)    All other components within normal limits    EKG None  Radiology US Abdomen Limited RUQ (LIVER/GB)  Result Date: 02/17/2022 CLINICAL DATA:  Right upper quadrant pain EXAM: ULTRASOUND ABDOMEN LIMITED RIGHT UPPER QUADRANT COMPARISON:  None Available. FINDINGS: Gallbladder: Gallbladder wall thickening. Gallstones visualized. No sonographic Murphy sign noted by sonographer. Common bile duct: Diameter: 2.4 mm Liver: No focal lesion identified. Increased parenchymal echogenicity. Portal vein is patent on color Doppler imaging with normal direction of blood flow towards the liver. Other: None. IMPRESSION: 1. Gallbladder wall  thickening, findings are concerning for acute cholecystitis. Sonographic Percell Miller sign is negative, which may be related to analgesics. Recommend clinical correlation. 2. Normal caliber common bile duct. 3. Hepatic steatosis. Electronically Signed   By: Yetta Glassman M.D.   On: 02/17/2022 09:51    Procedures Procedures    Medications Ordered in ED Medications  morphine (PF) 4 MG/ML injection 4 mg (4 mg Intravenous Given 02/17/22 1420)  ondansetron (ZOFRAN) injection 4 mg (4 mg Intravenous Given 02/17/22 1416)    ED Course/ Medical Decision Making/ A&P Clinical Course as of 02/17/22 1440  Wed Feb 17, 2022  1334 US performed as ordered  by traige PA showing GB wall thickening concerning for acute cholecystitis. [VK]  7703 I spoke with general surgery who plan to evaluate the patient at bedside prior to providing final recommendations. Patient will be given additional morphine and zofran for symptomatic management. [VK]  4035 Surgery evaluated the patient at bedside and plan to admit for further management of his acute cholecystitis. [VK]    Clinical Course User Index [VK] Ottie Glazier, DO                           Medical Decision Making Patient is a 66 year old male with a past medical history of cholelithiasis status post recent stent treatment of cholecystitis with PERC drain presenting to the emergency department with right upper quadrant pain.  Patient was initially evaluated by PA in triage and had labs and right upper quadrant ultrasound performed.  Labs showed normal LFTs, making choledocholithiasis unlikely.  Ultrasound did not show gallstones with gallbladder wall thickening, concerning for acute cholecystitis.  Patient is afebrile here with normal white count.  Surgery will be consulted.  He will be given symptomatic treatment.  Risk Prescription drug management. Decision regarding hospitalization.         Final Clinical Impression(s) / ED Diagnoses Final  diagnoses:  Cholecystitis    Rx / DC Orders ED Discharge Orders     None         Ottie Glazier, DO 02/17/22 1441

## 2022-02-17 NOTE — Progress Notes (Signed)
Referring Physician(s): White,C  Supervising Physician: Markus Daft  Patient Status:  [facility] - In-pt  Chief Complaint:  Right upper abdominal pain, nausea, cholecystitis  Subjective: Patient familiar to IR service from percutaneous cholecystostomy on 01/15/2022 for acute cholecystitis.  He is a 66 year old male with past medical history significant for anxiety, arthritis, prostate cancer, alcoholic cirrhosis, esophageal varices, thrombocytopenia, diabetes, GERD, colon polyps, hypertension, and hyperlipidemia.  He was discharged from [facility] on 01/16/2022 with GB drain.  Secondary to increasing right upper quadrant pain and minimal drain output he underwent OP gallbladder drain injection at IR clinic on 02/05/2022 which revealed retraction of the drain into the peripheral liver margin outside the gallbladder.  Limited ultrasound confirmed that the gallbladder was nondistended and collapsed at that time and drain was subsequently cut and removed. [facility] was notified.  Since that time he has been on p.o. antibiotics with plans underway for cholecystectomy on 8/1.  He presented to [facility] ED earlier this morning with acute onset of recurrent right upper quadrant pain along with intermittent nausea.  Limited ultrasound of right upper quadrant abdomen revealed gallbladder wall thickening  and findings concerning for acute cholecystitis, normal caliber common bile duct and hepatic steatosis. He is currently afebrile, BP okay, WBC normal, hemoglobin 12.8, platelets 118K, creatinine normal, PT/INR normal, total bilirubin normal.  Patient was reevaluated by surgery and request now received for replacement of percutaneous cholecystostomy drain.  Currently denies fever/chills, headache, chest pain, dyspnea, cough, back pain, vomiting or bleeding.  IV Zosyn has been ordered by CCS.  Past Medical History:  Diagnosis Date   Anxiety 06/01/2011   tx. Xanax   Arthritis 06/01/2011   hx. Gout-tx. Allopurinol   Cancer  ([place]) 06/01/2011   Bx.  with dx. 5 weeks ago-Prostate    Cirrhosis ([place])    GERD (gastroesophageal reflux disease) 06/01/2011   tx. Prilosec   History of colon polyps    Hyperlipidemia 06/01/2011   Hypertension    Seasonal allergies    Sinus disorder 06/01/2011   allergies chronic/ sinus issues   Past Surgical History:  Procedure Laterality Date   BIOPSY  01/05/2022   Procedure: BIOPSY;  Surgeon: Ronnette Juniper, MD;  Location: [facility];  Service: Gastroenterology;;   COLONOSCOPY  2020   Dr Earlean Shawl    ESOPHAGOGASTRODUODENOSCOPY (EGD) WITH PROPOFOL N/A 01/05/2022   Procedure: ESOPHAGOGASTRODUODENOSCOPY (EGD) WITH PROPOFOL;  Surgeon: Ronnette Juniper, MD;  Location: [facility];  Service: Gastroenterology;  Laterality: N/A;   IR PERC CHOLECYSTOSTOMY  01/15/2022   IR RADIOLOGIST EVAL & MGMT  02/05/2022   MANDIBLE FRACTURE SURGERY  06-01-11   Rt. lower jaw-retained hardware.   ROBOT ASSISTED LAPAROSCOPIC RADICAL PROSTATECTOMY  06/09/2011   Procedure: ROBOTIC ASSISTED LAPAROSCOPIC RADICAL PROSTATECTOMY;  Surgeon: Bernestine Amass, MD;  Location: [facility];  Service: Urology;  Laterality: Bilateral;  with Bilateral Lymph Node Dessection   WISDOM TOOTH EXTRACTION  06-01-11   all      Allergies: Atorvastatin, Simvastatin, Codeine, and Hydrocodone  Medications: Prior to Admission medications   Medication Sig Start Date End Date Taking? Authorizing Provider  ALPRAZolam Duanne Moron) 0.5 MG tablet Take 0.5 mg by mouth daily as needed for anxiety.    [provider]  Cholecalciferol (VITAMIN D3) 2000 UNITS TABS Take 4,000 Units by mouth every morning.    [provider]  Cinnamon 500 MG capsule Take 2,000 mg by mouth in the morning.    [provider]  esomeprazole (NEXIUM) 20 MG capsule Take 40 mg by  mouth in the morning and at bedtime.    [provider]  fexofenadine (ALLEGRA) 180 MG tablet Take 180 mg by mouth daily.    [provider]  fluticasone (FLONASE)  50 MCG/ACT nasal spray Place 2 sprays into both nostrils daily as needed for allergies.    [provider]  hydrOXYzine (ATARAX/VISTARIL) 25 MG tablet Take 50 mg by mouth at bedtime. 08/08/19   [provider]  ibuprofen (ADVIL) 200 MG tablet Take 400 mg by mouth every 6 (six) hours as needed for mild pain or headache.    [provider]  losartan (COZAAR) 100 MG tablet Take 50 mg by mouth at bedtime. 04/13/20   [provider]  methocarbamol (ROBAXIN) 500 MG tablet Take 1 tablet (500 mg total) by mouth 2 (two) times daily. Patient taking differently: Take 500 mg by mouth every 8 (eight) hours as needed for muscle spasms. 02/03/22   Larene Pickett, PA-C  metoprolol succinate (TOPROL-XL) 25 MG 24 hr tablet Take 12.5 mg by mouth at bedtime. 11/23/21   [provider]  mupirocin ointment (BACTROBAN) 2 % Apply 1 application  topically daily as needed (for breakouts on the nose).    [provider]  Omega-3 Fatty Acids (FISH OIL) 1200 MG CAPS Take 1,200 mg by mouth daily with breakfast.    [provider]  sucralfate (CARAFATE) 1 g tablet Take 1 g by mouth 2 (two) times daily before a meal.    [provider]     Vital Signs: BP 131/84   Pulse 80   Temp 98.1 F (36.7 C) (Oral)   Resp 18   Ht '5\' 7"'$  (1.702 m)   Wt 160 lb (72.6 kg)   SpO2 96%   BMI 25.06 kg/m   Physical Exam awake, alert.  Chest clear to auscultation bilaterally.  Heart with regular rate and rhythm.  Abdomen soft, positive bowel sounds, tender right upper quadrant to palpation.  No lower extremity edema  Imaging: US Abdomen Limited RUQ (LIVER/GB)  Result Date: 02/17/2022 CLINICAL DATA:  Right upper quadrant pain EXAM: ULTRASOUND ABDOMEN LIMITED RIGHT UPPER QUADRANT COMPARISON:  None Available. FINDINGS: Gallbladder: Gallbladder wall thickening. Gallstones visualized. No sonographic Murphy sign noted by sonographer. Common bile duct: Diameter: 2.4 mm Liver: No  focal lesion identified. Increased parenchymal echogenicity. Portal vein is patent on color Doppler imaging with normal direction of blood flow towards the liver. Other: None. IMPRESSION: 1. Gallbladder wall thickening, findings are concerning for acute cholecystitis. Sonographic Percell Miller sign is negative, which may be related to analgesics. Recommend clinical correlation. 2. Normal caliber common bile duct. 3. Hepatic steatosis. Electronically Signed   By: Yetta Glassman M.D.   On: 02/17/2022 09:51    Labs:  CBC: Recent Labs    01/16/22 0524 02/03/22 1302 02/17/22 0934 02/17/22 1444  WBC 4.0 10.7* 9.2 8.6  HGB 11.6* 14.2 14.0 12.8*  HCT 33.0* 39.2 37.9* 35.6*  PLT 63* 109* 126* 118*    COAGS: Recent Labs    12/05/21 1618 01/14/22 2119 01/15/22 0402 02/17/22 1444  INR 1.1 1.2 1.2 1.2    BMP: Recent Labs    01/15/22 0402 01/16/22 0524 02/03/22 1302 02/17/22 0934 02/17/22 1444  NA 142 140 140 140  --   K 3.4* 3.9 4.2 4.3  --   CL 113* 109 108 107  --   CO2 '23 24 23 24  '$ --   GLUCOSE 125* 108* 176* 212*  --   BUN 19 18  21 16  --   CALCIUM 8.4* 8.1* 9.8 9.8  --   CREATININE 1.18 1.15 1.27* 1.03 1.12  GFRNONAA >60 >60 >60 >60 >60    LIVER FUNCTION TESTS: Recent Labs    01/15/22 0402 01/16/22 0524 02/03/22 1302 02/17/22 0934  BILITOT 1.5* 1.5* 2.0* 1.1  AST 32 33 36 39  ALT 30 31 37 37  ALKPHOS 70 64 91 106  PROT 6.3* 6.0* 7.5 7.5  ALBUMIN 3.2* 3.1* 3.7 3.4*    Assessment and Plan: Patient familiar to IR service from percutaneous cholecystostomy on 01/15/2022 for acute cholecystitis.  He is a 66 year old male with past medical history significant for anxiety, arthritis, prostate cancer, alcoholic cirrhosis, esophageal varices, thrombocytopenia, diabetes, GERD, colon polyps, hypertension, and hyperlipidemia.  He was discharged from [facility] on 01/16/2022 with GB drain.  Secondary to increasing right upper quadrant pain and minimal drain output he underwent OP  gallbladder drain injection at IR clinic on 02/05/2022 which revealed retraction of the drain into the peripheral liver margin outside the gallbladder.  Limited ultrasound confirmed that the gallbladder was nondistended and collapsed at that time and drain was subsequently cut and removed. [facility] was notified.  Since that time he has been on p.o. antibiotics with plans underway for cholecystectomy on 8/1.  He presented to [facility] ED earlier this morning with acute onset of recurrent right upper quadrant pain along with intermittent nausea.  Limited ultrasound of right upper quadrant abdomen revealed gallbladder wall thickening  and findings concerning for acute cholecystitis, normal caliber common bile duct and hepatic steatosis. He is currently afebrile, BP okay, WBC normal, hemoglobin 12.8, platelets 118K, creatinine normal, PT/INR normal, total bilirubin normal.  Patient was reevaluated by surgery and request now received for replacement of percutaneous cholecystostomy drain.  Currently denies fever/chills, headache, chest pain, dyspnea, cough, back pain, vomiting or bleeding.  IV Zosyn has been ordered by CCS. Latest imaging studies have been reviewed by Dr. Vernard Gambles and case d/w CCS. Details/risks of procedure, including but not limited to, internal bleeding, infection, injury to adjacent structures, potential need for long-term drainage discussed with patient and spouse with their understanding and consent.  Procedure tentatively scheduled for 7/27.   Electronically Signed: D. Rowe Robert, PA-C 02/17/2022, 3:25 PM   I spent a total of 25 Minutes at the the patient's bedside AND on the patient's hospital floor or unit, greater than 50% of which was counseling/coordinating care for percutaneous cholecystostomy    Patient ID: Bobby Gamble, male   DOB: 1956/04/05, 66 y.o.   MRN: [id number]

## 2022-02-17 NOTE — ED Triage Notes (Signed)
Pt states he is scheduled to have his gallbladder taken out on Tuesday. Pt states his pain, nausea, and vomiting has worsened.

## 2022-02-17 NOTE — H&P (Signed)
Admission Note  Bobby Gamble 1955-08-21  [id number].    Requesting MD: Oneal Deputy, DO Chief Complaint/Reason for Consult: Acute cholecystitis  HPI:  Patient is a 66 year old male who presented to the ED with RUQ abdominal pain and n/v. Seen by our service for the same 6/23 and had percutaneous cholecystostomy placed for acute cholecystitis in setting of cirrhotic patient with esophageal varices and thrombocytopenia. Patient improved and was discharged home with drain. Drain became dislodged and was removed 7/14. Patient was started on amoxicillin after drain removal and finished antibiotics yesterday. He was planned for laparoscopic cholecystectomy with Dr. Donne Hazel 8/1. His wife is at bedside and reports drain was never bilious prior to its coming out. Denies fever, chills, chest pain, SOB, urinary symptoms or changes in bowel habits. Patient works as an Clinical biochemist. Reportedly has been sober from EtOH ~3 months. No allergies to abx. No blood thinners.   ROS: Negative other than HPI  Family History  Problem Relation Age of Onset   Liver disease Father        unsure of what type   Esophageal cancer Neg Hx    Colon cancer Neg Hx    Pancreatic cancer Neg Hx    Pancreatic disease Neg Hx    Colon polyps Neg Hx    Prostate cancer Neg Hx    Rectal cancer Neg Hx    Stomach cancer Neg Hx     Past Medical History:  Diagnosis Date   Anxiety 06/01/2011   tx. Xanax   Arthritis 06/01/2011   hx. Gout-tx. Allopurinol   Cancer ([place]) 06/01/2011   Bx.  with dx. 5 weeks ago-Prostate    Cirrhosis ([place])    GERD (gastroesophageal reflux disease) 06/01/2011   tx. Prilosec   History of colon polyps    Hyperlipidemia 06/01/2011   Hypertension    Seasonal allergies    Sinus disorder 06/01/2011   allergies chronic/ sinus issues    Past Surgical History:  Procedure Laterality Date   BIOPSY  01/05/2022   Procedure: BIOPSY;  Surgeon: Ronnette Juniper, MD;  [facility];   Service: Gastroenterology;;   COLONOSCOPY  2020   Dr Earlean Shawl    ESOPHAGOGASTRODUODENOSCOPY (EGD) WITH PROPOFOL N/A 01/05/2022   Procedure: ESOPHAGOGASTRODUODENOSCOPY (EGD) WITH PROPOFOL;  Surgeon: Ronnette Juniper, MD;  [facility];  Service: Gastroenterology;  Laterality: N/A;   IR PERC CHOLECYSTOSTOMY  01/15/2022   IR RADIOLOGIST EVAL & MGMT  02/05/2022   MANDIBLE FRACTURE SURGERY  06-01-11   Rt. lower jaw-retained hardware.   ROBOT ASSISTED LAPAROSCOPIC RADICAL PROSTATECTOMY  06/09/2011   Procedure: ROBOTIC ASSISTED LAPAROSCOPIC RADICAL PROSTATECTOMY;  Surgeon: Bernestine Amass, MD;  Location: [facility];  Service: Urology;  Laterality: Bilateral;  with Bilateral Lymph Node Dessection   WISDOM TOOTH EXTRACTION  06-01-11   all    Social History:  reports that he has quit smoking. He has never used smokeless tobacco. He reports that he does not currently use alcohol after a past usage of about 4.0 standard drinks of alcohol per week. He reports that he does not use drugs.  Allergies:  Allergies  Allergen Reactions   Atorvastatin Other (See Comments)    Joints ache   Simvastatin Other (See Comments)    Muscle aches   Codeine Nausea And Vomiting   Hydrocodone Nausea Only, Anxiety and Other (See Comments)    Made the patient feel sick and nervous    (Not in a hospital admission)   Blood  pressure (!) 165/98, pulse 82, temperature 98.1 F (36.7 C), temperature source Oral, resp. rate 18, height '5\' 7"'$  (1.702 m), weight 72.6 kg, SpO2 98 %. Physical Exam:  General: pleasant, WD, WN male who is laying in bed in NAD HEENT: head is normocephalic, atraumatic.  Sclera are anicteric.  Ears and nose without any masses or lesions.  Mouth is pink and moist Heart: regular, rate, and rhythm.  Palpable radial and pedal pulses bilaterally Lungs: Respiratory effort nonlabored Abd: soft, mild to moderate ttp in RUQ, ND, +BS, no masses, hernias, or organomegaly MS: all 4 extremities are symmetrical with  no cyanosis, clubbing, or edema. Skin: warm and dry with no masses, lesions, or rashes Neuro: Cranial nerves 2-12 grossly intact, sensation is normal throughout Psych: A&Ox3 with an appropriate affect.   Results for orders placed or performed during the hospital encounter of 02/17/22 (from the past 48 hour(s))  CBC with Differential     Status: Abnormal   Collection Time: 02/17/22  9:34 AM  Result Value Ref Range   WBC 9.2 4.0 - 10.5 K/uL   RBC 4.06 (L) 4.22 - 5.81 MIL/uL   Hemoglobin 14.0 13.0 - 17.0 g/dL   HCT 37.9 (L) 39.0 - 52.0 %   MCV 93.3 80.0 - 100.0 fL   MCH 34.5 (H) 26.0 - 34.0 pg   MCHC 36.9 (H) 30.0 - 36.0 g/dL   RDW 12.8 11.5 - 15.5 %   Platelets 126 (L) 150 - 400 K/uL   nRBC 0.0 0.0 - 0.2 %   Neutrophils Relative % 73 %   Neutro Abs 6.8 1.7 - 7.7 K/uL   Lymphocytes Relative 17 %   Lymphs Abs 1.6 0.7 - 4.0 K/uL   Monocytes Relative 6 %   Monocytes Absolute 0.6 0.1 - 1.0 K/uL   Eosinophils Relative 2 %   Eosinophils Absolute 0.2 0.0 - 0.5 K/uL   Basophils Relative 1 %   Basophils Absolute 0.1 0.0 - 0.1 K/uL   Immature Granulocytes 1 %   Abs Immature Granulocytes 0.05 0.00 - 0.07 K/uL    Comment: Performed at [facility], [place] [address] [place], [place] [address]  Comprehensive metabolic panel     Status: Abnormal   Collection Time: 02/17/22  9:34 AM  Result Value Ref Range   Sodium 140 135 - 145 mmol/L   Potassium 4.3 3.5 - 5.1 mmol/L   Chloride 107 98 - 111 mmol/L   CO2 24 22 - 32 mmol/L   Glucose, Bld 212 (H) 70 - 99 mg/dL    Comment: Glucose reference range applies only to samples taken after fasting for at least 8 hours.   BUN 16 8 - 23 mg/dL   Creatinine, Ser 1.03 0.61 - 1.24 mg/dL   Calcium 9.8 8.9 - 10.3 mg/dL   Total Protein 7.5 6.5 - 8.1 g/dL   Albumin 3.4 (L) 3.5 - 5.0 g/dL   AST 39 15 - 41 U/L   ALT 37 0 - 44 U/L   Alkaline Phosphatase 106 38 - 126 U/L   Total Bilirubin 1.1 0.3 - 1.2 mg/dL   GFR, Estimated >60 >60 mL/min     Comment: (NOTE) Calculated using the CKD-EPI Creatinine Equation (2021)    Anion gap 9 5 - 15    Comment: Performed at [facility], [place] [address] [place], [place] [address]   US Abdomen Limited RUQ (LIVER/GB)  Result Date: 02/17/2022 CLINICAL DATA:  Right upper quadrant pain EXAM: ULTRASOUND ABDOMEN  LIMITED RIGHT UPPER QUADRANT COMPARISON:  None Available. FINDINGS: Gallbladder: Gallbladder wall thickening. Gallstones visualized. No sonographic Murphy sign noted by sonographer. Common bile duct: Diameter: 2.4 mm Liver: No focal lesion identified. Increased parenchymal echogenicity. Portal vein is patent on color Doppler imaging with normal direction of blood flow towards the liver. Other: None. IMPRESSION: 1. Gallbladder wall thickening, findings are concerning for acute cholecystitis. Sonographic Percell Miller sign is negative, which may be related to analgesics. Recommend clinical correlation. 2. Normal caliber common bile duct. 3. Hepatic steatosis. Electronically Signed   By: Yetta Glassman M.D.   On: 02/17/2022 09:51      Assessment/Plan Recurrent Acute cholecystitis  Patient known to our service, recently had placement of percutaneous cholecystostomy tube on 6/23 for acute cholecystitis in setting of cirrhosis with thrombocytopenia and esophageal varices. Perc drain became dislodged and was removed 7/14. Patient was planned for lap chole on 8/1 with Dr. Donne Hazel but comes back in with recurrent pain and symptoms of acute cholecystitis. No leukocytosis or elevation in LFTs but US shows gallbladder wall thickening and exam/hx consistent with recurrent cholecystitis. In setting of recurrent inflammation in a patient with cirrhosis would recommend replacing percutaneous cholecystostomy and trying to cool off infection/inflammation prior to proceeding with laparoscopic cholecystectomy. Surgery will admit to observation and consult IR. Keep NPO for now and start IV abx. Check  PT-INR.   FEN: NPO, IVF '@75'$  cc/h VTE: LMWH to start tomorrow AM ID: Zosyn   HTN HLD EtOH Cirrhosis with esophageal varices, thrombocytopenia better - sober ~3 months  Hx of prostate cancer s/p robotic assisted prostatectomy  GERD  I reviewed ED provider notes, last 24 h vitals and pain scores, last 48 h intake and output, last 24 h labs and trends, and last 24 h imaging results.   Norm Parcel, [facility] Surgery 02/17/2022, 2:38 PM Please see Amion for pager number during day hours 7:00am-4:30pm

## 2022-02-17 NOTE — ED Provider Triage Note (Signed)
Emergency Medicine Provider Triage Evaluation Note  Bobby Gamble , a 66 y.o. male  was evaluated in triage.  Patient with mild cholecystitis presents today for evaluation of worsening abdominal pain, nausea, and vomiting.  States he has been up since 1 AM.  Patient is due to undergo cholecystectomy on Tuesday but presents due to worsening symptoms.  Patient previously had biliary drain in place due to cholecystitis to allow acute infection to resolve.  Review of Systems  Positive: As above Negative: As above  Physical Exam  BP (!) 128/92 (BP Location: Right Arm)   Pulse (!) 104   Temp 98.7 F (37.1 C) (Oral)   Resp 16   Ht '5\' 7"'$  (1.702 m)   Wt 72.6 kg   SpO2 94%   BMI 25.06 kg/m  Gen:   Awake, no distress   Resp:  Normal effort  MSK:   Moves extremities without difficulty  Other:  Diffuse abdominal tenderness present.  Medical Decision Making  Medically screening exam initiated at 8:38 AM.  Appropriate orders placed.  [name] was informed that the remainder of the evaluation will be completed by another provider, this initial triage assessment does not replace that evaluation, and the importance of remaining in the ED until their evaluation is complete.     Evlyn Courier, PA-C 02/17/22 [phone]

## 2022-02-18 ENCOUNTER — Observation Stay (HOSPITAL_COMMUNITY): Payer: Self-pay

## 2022-02-18 LAB — CBC
HCT: 34.1 % — ABNORMAL LOW (ref 39.0–52.0)
Hemoglobin: 12.1 g/dL — ABNORMAL LOW (ref 13.0–17.0)
MCH: 34.3 pg — ABNORMAL HIGH (ref 26.0–34.0)
MCHC: 35.5 g/dL (ref 30.0–36.0)
MCV: 96.6 fL (ref 80.0–100.0)
Platelets: 105 10*3/uL — ABNORMAL LOW (ref 150–400)
RBC: 3.53 MIL/uL — ABNORMAL LOW (ref 4.22–5.81)
RDW: 13.2 % (ref 11.5–15.5)
WBC: 6 10*3/uL (ref 4.0–10.5)
nRBC: 0 % (ref 0.0–0.2)

## 2022-02-18 LAB — COMPREHENSIVE METABOLIC PANEL
ALT: 32 U/L (ref 0–44)
AST: 32 U/L (ref 15–41)
Albumin: 2.9 g/dL — ABNORMAL LOW (ref 3.5–5.0)
Alkaline Phosphatase: 69 U/L (ref 38–126)
Anion gap: 8 (ref 5–15)
BUN: 14 mg/dL (ref 8–23)
CO2: 24 mmol/L (ref 22–32)
Calcium: 8.9 mg/dL (ref 8.9–10.3)
Chloride: 109 mmol/L (ref 98–111)
Creatinine, Ser: 1.28 mg/dL — ABNORMAL HIGH (ref 0.61–1.24)
GFR, Estimated: >60 mL/min (ref >60)
Glucose, Bld: 129 mg/dL — ABNORMAL HIGH (ref 70–99)
Potassium: 3.9 mmol/L (ref 3.5–5.1)
Sodium: 141 mmol/L (ref 135–145)
Total Bilirubin: 1.3 mg/dL — ABNORMAL HIGH (ref 0.3–1.2)
Total Protein: 6.2 g/dL — ABNORMAL LOW (ref 6.5–8.1)

## 2022-02-18 LAB — PROTIME-INR
INR: 1.3 — ABNORMAL HIGH (ref 0.8–1.2)
Prothrombin Time: 15.8 seconds — ABNORMAL HIGH (ref 11.4–15.2)

## 2022-02-18 MED ORDER — FENTANYL CITRATE (PF) 100 MCG/2ML IJ SOLN
INTRAMUSCULAR | Status: AC
  Filled 2022-02-18: qty 2

## 2022-02-18 MED ORDER — OXYCODONE HCL 5 MG PO TABS: 5.0000 mg | ORAL_TABLET | Freq: Four times a day (QID) | ORAL | 0 refills | Status: AC | PRN

## 2022-02-18 MED ORDER — FENTANYL CITRATE (PF) 100 MCG/2ML IJ SOLN
INTRAMUSCULAR | Status: AC | PRN
  Administered 2022-02-18: 50 ug via INTRAVENOUS

## 2022-02-18 MED ORDER — AMOXICILLIN-POT CLAVULANATE 875-125 MG PO TABS
1.0000 | ORAL_TABLET | Freq: Two times a day (BID) | ORAL | 0 refills | Status: DC
Start: 2022-02-18 — End: 2022-02-24

## 2022-02-18 MED ORDER — MIDAZOLAM HCL 2 MG/2ML IJ SOLN
INTRAMUSCULAR | Status: AC
  Filled 2022-02-18: qty 2

## 2022-02-18 MED ORDER — LIDOCAINE HCL 1 % IJ SOLN
INTRAMUSCULAR | Status: AC
  Administered 2022-02-18: 0.1 mL
  Filled 2022-02-18: qty 20

## 2022-02-18 MED ORDER — MIDAZOLAM HCL 2 MG/2ML IJ SOLN
INTRAMUSCULAR | Status: AC | PRN
  Administered 2022-02-18: 1 mg via INTRAVENOUS

## 2022-02-18 MED ORDER — ACETAMINOPHEN 500 MG PO TABS: 500.0000 mg | ORAL_TABLET | Freq: Three times a day (TID) | ORAL | Status: AC | PRN

## 2022-02-18 NOTE — Progress Notes (Signed)
Assessment unchanged. Back from IR procedure about 45 min. Did not received drain. VSS and pt alert and oriented x3. Dr. Dema Severin at bedside after returning from procedural area. Pt verbalized understanding of dc instructions including medications and follow up care. Discharged via wc to front entrance accompanied by NT.

## 2022-02-18 NOTE — Progress Notes (Signed)
Patient was brought to IR for Cholecystostomy drain placement.  On US examination, his gallbladder is contracted, and not amenable to drain placement.  Given the interval change in size between the most recent ultrasound and current exam, the cystic duct is likely patent.  Thickening of the gallbladder wall seen on most recent US likely a combination of low grade chronic cholecystitis and reactive changes related to cirrhosis.  Findings discussed with Barkley Boards PA.  Please call IR with any questions or concern.  [place], MD [phone]

## 2022-02-18 NOTE — Plan of Care (Signed)

## 2022-02-18 NOTE — Sedation Documentation (Signed)
Dr. Dwaine Gale determined no need for a drain at this time.  Patient will be sent back to the floor.  Patient is alert and oriented.

## 2022-02-18 NOTE — Progress Notes (Signed)
Progress Note     Subjective: Pt reports some RUQ pain overnight, pain medication keeps him awake so he didn't want to take any overnight. He denies n/v. Hopeful to go home later today. Still hoping to be able to proceed with surgery soon.   Objective: Vital signs in last 24 hours: Temp:  [97.8 F (36.6 C)-98.4 F (36.9 C)] 98.4 F (36.9 C) (07/27 0912) Pulse Rate:  [63-85] 65 (07/27 0912) Resp:  [16-18] 17 (07/27 0912) BP: (122-165)/(74-98) 135/88 (07/27 0912) SpO2:  [74 %-98 %] 95 % (07/27 0912) Last BM Date : 02/16/22  Intake/Output from previous day: 07/26 0701 - 07/27 0700 In: 1337.9 [P.O.:540; I.V.:748; IV Piggyback:49.9] Out: 1400 [Urine:1400] Intake/Output this shift: No intake/output data recorded.  PE: General: pleasant, WD, WN male who is laying in bed in NAD HEENT: Sclera are anicteric.   Lungs: Respiratory effort nonlabored Abd: soft, mild ttp in RUQ, ND, +BS, no masses, hernias, or organomegaly Psych: A&Ox3 with an appropriate affect.   Lab Results:  Recent Labs    02/17/22 1444 02/18/22 0450  WBC 8.6 6.0  HGB 12.8* 12.1*  HCT 35.6* 34.1*  PLT 118* 105*   BMET Recent Labs    02/17/22 0934 02/17/22 1444 02/18/22 0450  NA 140  --  141  K 4.3  --  3.9  CL 107  --  109  CO2 24  --  24  GLUCOSE 212*  --  129*  BUN 16  --  14  CREATININE 1.03 1.12 1.28*  CALCIUM 9.8  --  8.9   PT/INR Recent Labs    02/17/22 1444 02/18/22 0450  LABPROT 15.2 15.8*  INR 1.2 1.3*   CMP     Component Value Date/Time   NA 141 02/18/2022 0450   K 3.9 02/18/2022 0450   CL 109 02/18/2022 0450   CO2 24 02/18/2022 0450   GLUCOSE 129 (H) 02/18/2022 0450   BUN 14 02/18/2022 0450   CREATININE 1.28 (H) 02/18/2022 0450   CREATININE 1.16 10/01/2020 1421   CALCIUM 8.9 02/18/2022 0450   PROT 6.2 (L) 02/18/2022 0450   ALBUMIN 2.9 (L) 02/18/2022 0450   AST 32 02/18/2022 0450   AST 38 10/01/2020 1421   ALT 32 02/18/2022 0450   ALT 44 10/01/2020 1421   ALKPHOS  69 02/18/2022 0450   BILITOT 1.3 (H) 02/18/2022 0450   BILITOT 1.1 10/01/2020 1421   GFRNONAA >60 02/18/2022 0450   GFRNONAA >60 10/01/2020 1421   GFRAA 74 (L) 06/10/2011 0447   Lipase     Component Value Date/Time   LIPASE 41 01/14/2022 1125       Studies/Results: US Abdomen Limited RUQ (LIVER/GB)  Result Date: 02/17/2022 CLINICAL DATA:  Right upper quadrant pain EXAM: ULTRASOUND ABDOMEN LIMITED RIGHT UPPER QUADRANT COMPARISON:  None Available. FINDINGS: Gallbladder: Gallbladder wall thickening. Gallstones visualized. No sonographic Murphy sign noted by sonographer. Common bile duct: Diameter: 2.4 mm Liver: No focal lesion identified. Increased parenchymal echogenicity. Portal vein is patent on color Doppler imaging with normal direction of blood flow towards the liver. Other: None. IMPRESSION: 1. Gallbladder wall thickening, findings are concerning for acute cholecystitis. Sonographic Percell Miller sign is negative, which may be related to analgesics. Recommend clinical correlation. 2. Normal caliber common bile duct. 3. Hepatic steatosis. Electronically Signed   By: Yetta Glassman M.D.   On: 02/17/2022 09:51    Anti-infectives: Anti-infectives (From admission, onward)    Start     Dose/Rate Route Frequency Ordered Stop  02/17/22 1600  piperacillin-tazobactam (ZOSYN) IVPB 3.375 g        3.375 g 12.5 mL/hr over 240 Minutes Intravenous Every 8 hours 02/17/22 1445 02/24/22 1559        Assessment/Plan Recurrent Acute cholecystitis  Patient known to our service, recently had placement of percutaneous cholecystostomy tube on 6/23 for acute cholecystitis in setting of cirrhosis with thrombocytopenia and esophageal varices. Perc drain became dislodged and was removed 7/14. Patient was planned for lap chole on 8/1 with Dr. Donne Hazel but comes back in with recurrent pain and symptoms of acute cholecystitis. No leukocytosis or elevation in LFTs but US shows gallbladder wall thickening and  exam/hx consistent with recurrent cholecystitis. In setting of recurrent inflammation in a patient with cirrhosis would recommend replacing percutaneous cholecystostomy and trying to cool off infection/inflammation prior to proceeding with laparoscopic cholecystectomy. - plans for perc chole drain today - possible discharge after drain placement later today vs tomorrow AM   FEN: NPO, IVF '@75'$  cc/h VTE: LMWH to start this afternoon ID: Zosyn 7/26>>   HTN HLD EtOH Cirrhosis with esophageal varices, thrombocytopenia better - sober ~3 months  Hx of prostate cancer s/p robotic assisted prostatectomy  GERD   LOS: 0 days   I reviewed Consultant IR notes, last 24 h vitals and pain scores, last 48 h intake and output, and last 24 h labs and trends.    Norm Parcel, [facility] Surgery 02/18/2022, 9:52 AM Please see Amion for pager number during day hours 7:00am-4:30pm

## 2022-02-18 NOTE — Progress Notes (Signed)
Transition of Care [facility]) Screening Note  Patient Details  Name: Bobby Gamble Date of Birth: 1955-12-30  Transition of Care [facility]) CM/SW Contact:    Sherie Don, LCSW Phone Number: 02/18/2022, 10:35 AM  Transition of Care Department [facility]) has reviewed patient and no TOC needs have been identified at this time. We will continue to monitor patient advancement through interdisciplinary progression rounds. If new patient transition needs arise, please place a TOC consult.

## 2022-02-18 NOTE — H&P (View-Only) (Signed)
Progress Note     Subjective: Pt reports some RUQ pain overnight, pain medication keeps him awake so he didn't want to take any overnight. He denies n/v. Hopeful to go home later today. Still hoping to be able to proceed with surgery soon.   Objective: Vital signs in last 24 hours: Temp:  [97.8 F (36.6 C)-98.4 F (36.9 C)] 98.4 F (36.9 C) (07/27 0912) Pulse Rate:  [63-85] 65 (07/27 0912) Resp:  [16-18] 17 (07/27 0912) BP: (122-165)/(74-98) 135/88 (07/27 0912) SpO2:  [74 %-98 %] 95 % (07/27 0912) Last BM Date : 02/16/22  Intake/Output from previous day: 07/26 0701 - 07/27 0700 In: 1337.9 [P.O.:540; I.V.:748; IV Piggyback:49.9] Out: 1400 [Urine:1400] Intake/Output this shift: No intake/output data recorded.  PE: General: pleasant, WD, WN male who is laying in bed in NAD HEENT: Sclera are anicteric.   Lungs: Respiratory effort nonlabored Abd: soft, mild ttp in RUQ, ND, +BS, no masses, hernias, or organomegaly Psych: A&Ox3 with an appropriate affect.   Lab Results:  Recent Labs    02/17/22 1444 02/18/22 0450  WBC 8.6 6.0  HGB 12.8* 12.1*  HCT 35.6* 34.1*  PLT 118* 105*   BMET Recent Labs    02/17/22 0934 02/17/22 1444 02/18/22 0450  NA 140  --  141  K 4.3  --  3.9  CL 107  --  109  CO2 24  --  24  GLUCOSE 212*  --  129*  BUN 16  --  14  CREATININE 1.03 1.12 1.28*  CALCIUM 9.8  --  8.9   PT/INR Recent Labs    02/17/22 1444 02/18/22 0450  LABPROT 15.2 15.8*  INR 1.2 1.3*   CMP     Component Value Date/Time   NA 141 02/18/2022 0450   K 3.9 02/18/2022 0450   CL 109 02/18/2022 0450   CO2 24 02/18/2022 0450   GLUCOSE 129 (H) 02/18/2022 0450   BUN 14 02/18/2022 0450   CREATININE 1.28 (H) 02/18/2022 0450   CREATININE 1.16 10/01/2020 1421   CALCIUM 8.9 02/18/2022 0450   PROT 6.2 (L) 02/18/2022 0450   ALBUMIN 2.9 (L) 02/18/2022 0450   AST 32 02/18/2022 0450   AST 38 10/01/2020 1421   ALT 32 02/18/2022 0450   ALT 44 10/01/2020 1421   ALKPHOS  69 02/18/2022 0450   BILITOT 1.3 (H) 02/18/2022 0450   BILITOT 1.1 10/01/2020 1421   GFRNONAA >60 02/18/2022 0450   GFRNONAA >60 10/01/2020 1421   GFRAA 74 (L) 06/10/2011 0447   Lipase     Component Value Date/Time   LIPASE 41 01/14/2022 1125       Studies/Results: US Abdomen Limited RUQ (LIVER/GB)  Result Date: 02/17/2022 CLINICAL DATA:  Right upper quadrant pain EXAM: ULTRASOUND ABDOMEN LIMITED RIGHT UPPER QUADRANT COMPARISON:  None Available. FINDINGS: Gallbladder: Gallbladder wall thickening. Gallstones visualized. No sonographic Murphy sign noted by sonographer. Common bile duct: Diameter: 2.4 mm Liver: No focal lesion identified. Increased parenchymal echogenicity. Portal vein is patent on color Doppler imaging with normal direction of blood flow towards the liver. Other: None. IMPRESSION: 1. Gallbladder wall thickening, findings are concerning for acute cholecystitis. Sonographic Percell Miller sign is negative, which may be related to analgesics. Recommend clinical correlation. 2. Normal caliber common bile duct. 3. Hepatic steatosis. Electronically Signed   By: Yetta Glassman M.D.   On: 02/17/2022 09:51    Anti-infectives: Anti-infectives (From admission, onward)    Start     Dose/Rate Route Frequency Ordered Stop  02/17/22 1600  piperacillin-tazobactam (ZOSYN) IVPB 3.375 g        3.375 g 12.5 mL/hr over 240 Minutes Intravenous Every 8 hours 02/17/22 1445 02/24/22 1559        Assessment/Plan Recurrent Acute cholecystitis  Patient known to our service, recently had placement of percutaneous cholecystostomy tube on 6/23 for acute cholecystitis in setting of cirrhosis with thrombocytopenia and esophageal varices. Perc drain became dislodged and was removed 7/14. Patient was planned for lap chole on 8/1 with Dr. Donne Hazel but comes back in with recurrent pain and symptoms of acute cholecystitis. No leukocytosis or elevation in LFTs but US shows gallbladder wall thickening and  exam/hx consistent with recurrent cholecystitis. In setting of recurrent inflammation in a patient with cirrhosis would recommend replacing percutaneous cholecystostomy and trying to cool off infection/inflammation prior to proceeding with laparoscopic cholecystectomy. - plans for perc chole drain today - possible discharge after drain placement later today vs tomorrow AM   FEN: NPO, IVF '@75'$  cc/h VTE: LMWH to start this afternoon ID: Zosyn 7/26>>   HTN HLD EtOH Cirrhosis with esophageal varices, thrombocytopenia better - sober ~3 months  Hx of prostate cancer s/p robotic assisted prostatectomy  GERD   LOS: 0 days   I reviewed Consultant IR notes, last 24 h vitals and pain scores, last 48 h intake and output, and last 24 h labs and trends.    Norm Parcel, [facility] Surgery 02/18/2022, 9:52 AM Please see Amion for pager number during day hours 7:00am-4:30pm

## 2022-02-18 NOTE — Discharge Summary (Signed)
[facility] Discharge Summary   Patient ID: Bobby Gamble MRN: [id number] DOB/AGE: October 03, 1955 66 y.o.  Admit date: 02/17/2022 Discharge date: 02/18/2022  Admitting Diagnosis: Acute on chronic cholecystitis   Discharge Diagnosis Same as above  Consultants IR  Imaging: US Abdomen Limited RUQ (LIVER/GB)  Result Date: 02/17/2022 CLINICAL DATA:  Right upper quadrant pain EXAM: ULTRASOUND ABDOMEN LIMITED RIGHT UPPER QUADRANT COMPARISON:  None Available. FINDINGS: Gallbladder: Gallbladder wall thickening. Gallstones visualized. No sonographic Murphy sign noted by sonographer. Common bile duct: Diameter: 2.4 mm Liver: No focal lesion identified. Increased parenchymal echogenicity. Portal vein is patent on color Doppler imaging with normal direction of blood flow towards the liver. Other: None. IMPRESSION: 1. Gallbladder wall thickening, findings are concerning for acute cholecystitis. Sonographic Percell Miller sign is negative, which may be related to analgesics. Recommend clinical correlation. 2. Normal caliber common bile duct. 3. Hepatic steatosis. Electronically Signed   By: Yetta Glassman M.D.   On: 02/17/2022 09:51    Procedures None  Hospital Course:  Patient is a 66 year old male s/p previous percutaneous cholecystostomy for acute cholecystitis who presented to the ED with RUQ abdominal pain.  Workup showed acute on chronic cholecystitis.  Patient was admitted and IR consulted for drain placement. When patient was brought down to IR, gallbladder was contracted and decompressed. Decision made not to proceed with cholecystostomy placement. Patient had no TTP on exam at that point and decision made to discharge home on PO abx and follow up with surgeon as planned. On 02/18/22, the patient was voiding well, tolerating diet, ambulating well, pain well controlled, vital signs stable and felt stable for discharge home.  Patient will follow up with our office next week and knows to call  with questions or concerns.    I or a member of my team have reviewed this patient in the Controlled Substance Database.   Allergies as of 02/18/2022       Reactions   Atorvastatin Other (See Comments)   Joints ache   Simvastatin Other (See Comments)   Muscle aches   Codeine Nausea And Vomiting, Anxiety, Other (See Comments)   Made the patient feel sick and nervous        Medication List     STOP taking these medications    oxyCODONE-acetaminophen 5-325 MG tablet Commonly known as: PERCOCET/ROXICET       TAKE these medications    acetaminophen 500 MG tablet Commonly known as: TYLENOL Take 1 tablet (500 mg total) by mouth every 8 (eight) hours as needed for mild pain or fever.   ALPRAZolam 0.5 MG tablet Commonly known as: XANAX Take 0.5 mg by mouth daily as needed for anxiety.   amoxicillin-clavulanate 875-125 MG tablet Commonly known as: AUGMENTIN Take 1 tablet by mouth 2 (two) times daily for 5 days.   Cinnamon 500 MG capsule Take 2,000 mg by mouth in the morning.   esomeprazole 20 MG capsule Commonly known as: NEXIUM Take 40 mg by mouth in the morning and at bedtime.   fexofenadine 180 MG tablet Commonly known as: ALLEGRA Take 180 mg by mouth daily.   Fish Oil 1200 MG Caps Take 1,200 mg by mouth daily with breakfast.   fluticasone 50 MCG/ACT nasal spray Commonly known as: FLONASE Place 2 sprays into both nostrils daily as needed for allergies.   hydrOXYzine 25 MG tablet Commonly known as: ATARAX Take 50 mg by mouth at bedtime.   ibuprofen 200 MG tablet Commonly known as: ADVIL Take 400 mg  by mouth every 6 (six) hours as needed for mild pain or headache.   losartan 100 MG tablet Commonly known as: COZAAR Take 50 mg by mouth at bedtime.   methocarbamol 500 MG tablet Commonly known as: ROBAXIN Take 1 tablet (500 mg total) by mouth 2 (two) times daily. What changed:  when to take this reasons to take this   metoprolol succinate 25 MG 24 hr  tablet Commonly known as: TOPROL-XL Take 12.5 mg by mouth at bedtime.   mupirocin ointment 2 % Commonly known as: BACTROBAN Apply 1 application  topically daily as needed (for breakouts on the nose).   oxyCODONE 5 MG immediate release tablet Commonly known as: Oxy IR/ROXICODONE Take 1 tablet (5 mg total) by mouth every 6 (six) hours as needed for moderate pain or severe pain.   sucralfate 1 g tablet Commonly known as: CARAFATE Take 1 g by mouth 2 (two) times daily before a meal.   Vitamin D3 50 MCG (2000 UT) Tabs Take 4,000 Units by mouth every morning.          Follow-up Information     Rolm Bookbinder, MD Follow up.   Specialty: General Surgery Why: Follow up as planned - I will send a message to Dr. Donne Hazel to let him know you got your drain placed 7/27 Contact information: [address] [place] [address] [phone]                 Signed: Norm Parcel , [facility] Surgery 02/18/2022, 4:18 PM Please see Amion for pager number during day hours 7:00am-4:30pm

## 2022-02-22 ENCOUNTER — Other Ambulatory Visit: Payer: Self-pay

## 2022-02-22 ENCOUNTER — Encounter (HOSPITAL_COMMUNITY): Payer: Self-pay | Admitting: General Surgery

## 2022-02-22 NOTE — Progress Notes (Signed)
I spoke with Bobby Gamble, he asked me to call his wife, Bobby Gamble.  I spoke with Bobby Gamble, she reports that Bobby Gamble does not complain of chest pain or shortness of breath. Bobby Gamble denies having any s/s of Covid in her household, also denies any known exposure to Covid.   PCP is Dr. Ashby Dawes.

## 2022-02-23 ENCOUNTER — Observation Stay (HOSPITAL_COMMUNITY)
Admission: RE | Admit: 2022-02-23 | Discharge: 2022-02-24 | Disposition: A | Discharge status: Home / Self Care | Payer: Self-pay | Attending: General Surgery | Admitting: General Surgery

## 2022-02-23 ENCOUNTER — Encounter (HOSPITAL_COMMUNITY)
Admission: RE | Disposition: A | Discharge status: Home / Self Care | Payer: Self-pay | Source: Home / Self Care | Attending: General Surgery

## 2022-02-23 ENCOUNTER — Other Ambulatory Visit: Payer: Self-pay

## 2022-02-23 ENCOUNTER — Ambulatory Visit (HOSPITAL_COMMUNITY): Payer: Self-pay | Admitting: Anesthesiology

## 2022-02-23 ENCOUNTER — Ambulatory Visit (HOSPITAL_BASED_OUTPATIENT_CLINIC_OR_DEPARTMENT_OTHER): Payer: Self-pay | Admitting: Anesthesiology

## 2022-02-23 ENCOUNTER — Encounter (HOSPITAL_COMMUNITY): Payer: Self-pay | Admitting: General Surgery

## 2022-02-23 DIAGNOSIS — K219 Gastro-esophageal reflux disease without esophagitis: Secondary | ICD-10-CM | POA: Insufficient documentation

## 2022-02-23 DIAGNOSIS — I10 Essential (primary) hypertension: Secondary | ICD-10-CM

## 2022-02-23 DIAGNOSIS — E785 Hyperlipidemia, unspecified: Secondary | ICD-10-CM | POA: Insufficient documentation

## 2022-02-23 DIAGNOSIS — K811 Chronic cholecystitis: Secondary | ICD-10-CM

## 2022-02-23 DIAGNOSIS — Z9049 Acquired absence of other specified parts of digestive tract: Secondary | ICD-10-CM

## 2022-02-23 DIAGNOSIS — F418 Other specified anxiety disorders: Secondary | ICD-10-CM

## 2022-02-23 DIAGNOSIS — Z8546 Personal history of malignant neoplasm of prostate: Secondary | ICD-10-CM | POA: Insufficient documentation

## 2022-02-23 DIAGNOSIS — K7460 Unspecified cirrhosis of liver: Secondary | ICD-10-CM | POA: Insufficient documentation

## 2022-02-23 HISTORY — PX: CHOLECYSTECTOMY: SHX55

## 2022-02-23 HISTORY — DX: Prediabetes: R73.03

## 2022-02-23 HISTORY — DX: Depression, unspecified: F32.A

## 2022-02-23 HISTORY — DX: Headache, unspecified: R51.9

## 2022-02-23 SURGERY — LAPAROSCOPIC CHOLECYSTECTOMY
Anesthesia: General | Site: Abdomen

## 2022-02-23 MED ORDER — CHLORHEXIDINE GLUCONATE 0.12 % MT SOLN
15.0000 mL | Freq: Once | OROMUCOSAL | Status: AC
  Administered 2022-02-23: 15 mL via OROMUCOSAL
  Filled 2022-02-23: qty 15

## 2022-02-23 MED ORDER — FENTANYL CITRATE (PF) 250 MCG/5ML IJ SOLN
INTRAMUSCULAR | Status: AC
  Filled 2022-02-23: qty 5

## 2022-02-23 MED ORDER — ACETAMINOPHEN 500 MG PO TABS: 1000.0000 mg | ORAL_TABLET | ORAL | Status: AC

## 2022-02-23 MED ORDER — PHENYLEPHRINE 80 MCG/ML (10ML) SYRINGE FOR IV PUSH (FOR BLOOD PRESSURE SUPPORT)
PREFILLED_SYRINGE | INTRAVENOUS | Status: AC
  Filled 2022-02-23: qty 10

## 2022-02-23 MED ORDER — MORPHINE SULFATE (PF) 2 MG/ML IV SOLN
2.0000 mg | INTRAVENOUS | Status: DC | PRN
  Administered 2022-02-23 (×2): 2 mg via INTRAVENOUS
  Filled 2022-02-23 (×2): qty 1

## 2022-02-23 MED ORDER — ONDANSETRON HCL 4 MG/2ML IJ SOLN
INTRAMUSCULAR | Status: DC | PRN
  Administered 2022-02-23: 4 mg via INTRAVENOUS

## 2022-02-23 MED ORDER — BUPIVACAINE HCL (PF) 0.25 % IJ SOLN
INTRAMUSCULAR | Status: DC | PRN
  Administered 2022-02-23 (×2): 20 mL via PERINEURAL

## 2022-02-23 MED ORDER — SIMETHICONE 80 MG PO CHEW: 40.0000 mg | CHEWABLE_TABLET | Freq: Four times a day (QID) | ORAL | Status: DC | PRN

## 2022-02-23 MED ORDER — METOPROLOL SUCCINATE ER 25 MG PO TB24
12.5000 mg | ORAL_TABLET | Freq: Every day | ORAL | Status: DC
Start: 2022-02-23 — End: 2022-02-25
  Administered 2022-02-23: 12.5 mg via ORAL
  Filled 2022-02-23: qty 1

## 2022-02-23 MED ORDER — OXYCODONE HCL 5 MG PO TABS
ORAL_TABLET | ORAL | Status: AC
  Filled 2022-02-23: qty 1

## 2022-02-23 MED ORDER — MIDAZOLAM HCL 2 MG/2ML IJ SOLN
INTRAMUSCULAR | Status: DC | PRN
  Administered 2022-02-23: 2 mg via INTRAVENOUS

## 2022-02-23 MED ORDER — CEFAZOLIN SODIUM-DEXTROSE 2-4 GM/100ML-% IV SOLN
INTRAVENOUS | Status: AC
  Filled 2022-02-23: qty 100

## 2022-02-23 MED ORDER — SPY AGENT GREEN - (INDOCYANINE FOR INJECTION)
1.0000 mg | Freq: Once | INTRAMUSCULAR | Status: AC
Start: 2022-02-23 — End: 2022-02-23
  Administered 2022-02-23: 1 mg via INTRAVENOUS
  Filled 2022-02-23: qty 10

## 2022-02-23 MED ORDER — METHOCARBAMOL 500 MG PO TABS: 500.0000 mg | ORAL_TABLET | Freq: Three times a day (TID) | ORAL | Status: DC | PRN

## 2022-02-23 MED ORDER — OXYCODONE HCL 5 MG PO TABS
5.0000 mg | ORAL_TABLET | ORAL | Status: DC | PRN
  Administered 2022-02-24: 10 mg via ORAL
  Filled 2022-02-23: qty 2

## 2022-02-23 MED ORDER — BUPIVACAINE LIPOSOME 1.3 % IJ SUSP
INTRAMUSCULAR | Status: DC | PRN
  Administered 2022-02-23 (×2): 5 mL via PERINEURAL

## 2022-02-23 MED ORDER — LOSARTAN POTASSIUM 50 MG PO TABS
50.0000 mg | ORAL_TABLET | Freq: Every day | ORAL | Status: DC
Start: 2022-02-24 — End: 2022-02-25

## 2022-02-23 MED ORDER — LIDOCAINE 2% (20 MG/ML) 5 ML SYRINGE
INTRAMUSCULAR | Status: AC
  Filled 2022-02-23: qty 5

## 2022-02-23 MED ORDER — ALBUMIN HUMAN 5 % IV SOLN: INTRAVENOUS | Status: DC | PRN

## 2022-02-23 MED ORDER — ACETAMINOPHEN 500 MG PO TABS
ORAL_TABLET | ORAL | Status: AC
  Administered 2022-02-23: 1000 mg via ORAL
  Filled 2022-02-23: qty 2

## 2022-02-23 MED ORDER — FENTANYL CITRATE (PF) 250 MCG/5ML IJ SOLN
INTRAMUSCULAR | Status: DC | PRN
  Administered 2022-02-23 (×2): 100 ug via INTRAVENOUS
  Administered 2022-02-23: 50 ug via INTRAVENOUS

## 2022-02-23 MED ORDER — HEMOSTATIC AGENTS (NO CHARGE) OPTIME
TOPICAL | Status: DC | PRN
  Administered 2022-02-23: 2 via TOPICAL

## 2022-02-23 MED ORDER — SUCRALFATE 1 G PO TABS
1.0000 g | ORAL_TABLET | Freq: Two times a day (BID) | ORAL | Status: DC
  Administered 2022-02-24: 1 g via ORAL
  Filled 2022-02-23: qty 1

## 2022-02-23 MED ORDER — BUPIVACAINE-EPINEPHRINE (PF) 0.25% -1:200000 IJ SOLN
INTRAMUSCULAR | Status: AC
  Filled 2022-02-23: qty 30

## 2022-02-23 MED ORDER — MIDAZOLAM HCL 2 MG/2ML IJ SOLN
INTRAMUSCULAR | Status: AC
  Filled 2022-02-23: qty 2

## 2022-02-23 MED ORDER — PROPOFOL 10 MG/ML IV BOLUS
INTRAVENOUS | Status: DC | PRN
  Administered 2022-02-23: 140 mg via INTRAVENOUS

## 2022-02-23 MED ORDER — DEXMEDETOMIDINE (PRECEDEX) IN NS 20 MCG/5ML (4 MCG/ML) IV SYRINGE
PREFILLED_SYRINGE | INTRAVENOUS | Status: DC | PRN
  Administered 2022-02-23: 20 ug via INTRAVENOUS

## 2022-02-23 MED ORDER — EPHEDRINE 5 MG/ML INJ
INTRAVENOUS | Status: AC
  Filled 2022-02-23: qty 5

## 2022-02-23 MED ORDER — ONDANSETRON HCL 4 MG/2ML IJ SOLN: 4.0000 mg | Freq: Four times a day (QID) | INTRAMUSCULAR | Status: DC | PRN

## 2022-02-23 MED ORDER — METHOCARBAMOL 1000 MG/10ML IJ SOLN: 500.0000 mg | Freq: Three times a day (TID) | INTRAVENOUS | Status: DC | PRN

## 2022-02-23 MED ORDER — CEFAZOLIN SODIUM-DEXTROSE 2-4 GM/100ML-% IV SOLN
2.0000 g | INTRAVENOUS | Status: AC
Start: 2022-02-23 — End: 2022-02-23
  Administered 2022-02-23: 2 g via INTRAVENOUS

## 2022-02-23 MED ORDER — ROCURONIUM BROMIDE 10 MG/ML (PF) SYRINGE
PREFILLED_SYRINGE | INTRAVENOUS | Status: AC
Start: 2022-02-23 — End: ?
  Filled 2022-02-23: qty 10

## 2022-02-23 MED ORDER — OXYCODONE HCL 5 MG PO TABS
5.0000 mg | ORAL_TABLET | Freq: Once | ORAL | Status: AC | PRN
  Administered 2022-02-23: 5 mg via ORAL

## 2022-02-23 MED ORDER — ROCURONIUM BROMIDE 10 MG/ML (PF) SYRINGE
PREFILLED_SYRINGE | INTRAVENOUS | Status: DC | PRN
  Administered 2022-02-23: 20 mg via INTRAVENOUS
  Administered 2022-02-23: 60 mg via INTRAVENOUS

## 2022-02-23 MED ORDER — PHENYLEPHRINE 80 MCG/ML (10ML) SYRINGE FOR IV PUSH (FOR BLOOD PRESSURE SUPPORT)
PREFILLED_SYRINGE | INTRAVENOUS | Status: DC | PRN
  Administered 2022-02-23 (×4): 80 ug via INTRAVENOUS

## 2022-02-23 MED ORDER — LACTATED RINGERS IV SOLN
INTRAVENOUS | Status: DC
Start: 2022-02-23 — End: 2022-02-23

## 2022-02-23 MED ORDER — SODIUM CHLORIDE 0.9 % IV SOLN: INTRAVENOUS | Status: DC

## 2022-02-23 MED ORDER — PANTOPRAZOLE SODIUM 40 MG PO TBEC
40.0000 mg | DELAYED_RELEASE_TABLET | Freq: Every day | ORAL | Status: DC
  Administered 2022-02-23 – 2022-02-24 (×2): 40 mg via ORAL
  Filled 2022-02-23 (×2): qty 1

## 2022-02-23 MED ORDER — SODIUM CHLORIDE 0.9 % IR SOLN
Status: DC | PRN
  Administered 2022-02-23: 1000 mL

## 2022-02-23 MED ORDER — ORAL CARE MOUTH RINSE
15.0000 mL | Freq: Once | OROMUCOSAL | Status: AC
Start: 2022-02-23 — End: 2022-02-23

## 2022-02-23 MED ORDER — DEXAMETHASONE SODIUM PHOSPHATE 10 MG/ML IJ SOLN
INTRAMUSCULAR | Status: AC
Start: 2022-02-23 — End: ?
  Filled 2022-02-23: qty 2

## 2022-02-23 MED ORDER — DEXAMETHASONE SODIUM PHOSPHATE 10 MG/ML IJ SOLN
INTRAMUSCULAR | Status: DC | PRN
  Administered 2022-02-23: 4 mg via INTRAVENOUS

## 2022-02-23 MED ORDER — FENTANYL CITRATE (PF) 100 MCG/2ML IJ SOLN
INTRAMUSCULAR | Status: AC
  Administered 2022-02-23: 100 ug via INTRAVENOUS
  Filled 2022-02-23: qty 2

## 2022-02-23 MED ORDER — HYDROMORPHONE HCL 1 MG/ML IJ SOLN
INTRAMUSCULAR | Status: AC
  Filled 2022-02-23: qty 1

## 2022-02-23 MED ORDER — HYDROMORPHONE HCL 1 MG/ML IJ SOLN
0.5000 mg | INTRAMUSCULAR | Status: DC | PRN
  Administered 2022-02-23 – 2022-02-24 (×4): 1 mg via INTRAVENOUS
  Filled 2022-02-23 (×4): qty 1

## 2022-02-23 MED ORDER — FLUTICASONE PROPIONATE 50 MCG/ACT NA SUSP: 2.0000 | Freq: Every day | NASAL | Status: DC | PRN

## 2022-02-23 MED ORDER — FENTANYL CITRATE (PF) 100 MCG/2ML IJ SOLN: 100.0000 ug | Freq: Once | INTRAMUSCULAR | Status: AC

## 2022-02-23 MED ORDER — OXYCODONE HCL 5 MG/5ML PO SOLN: 5.0000 mg | Freq: Once | ORAL | Status: AC | PRN

## 2022-02-23 MED ORDER — MIDAZOLAM HCL 2 MG/2ML IJ SOLN: 2.0000 mg | Freq: Once | INTRAMUSCULAR | Status: AC

## 2022-02-23 MED ORDER — 0.9 % SODIUM CHLORIDE (POUR BTL) OPTIME
TOPICAL | Status: DC | PRN
  Administered 2022-02-23: 1000 mL

## 2022-02-23 MED ORDER — ONDANSETRON 4 MG PO TBDP: 4.0000 mg | ORAL_TABLET | Freq: Four times a day (QID) | ORAL | Status: DC | PRN

## 2022-02-23 MED ORDER — ALPRAZOLAM 0.25 MG PO TABS: 0.5000 mg | ORAL_TABLET | Freq: Every day | ORAL | Status: DC | PRN

## 2022-02-23 MED ORDER — ONDANSETRON HCL 4 MG/2ML IJ SOLN
INTRAMUSCULAR | Status: AC
  Filled 2022-02-23: qty 4

## 2022-02-23 MED ORDER — BUPIVACAINE-EPINEPHRINE 0.25% -1:200000 IJ SOLN
INTRAMUSCULAR | Status: DC | PRN
  Administered 2022-02-23: 8 mL

## 2022-02-23 MED ORDER — LIDOCAINE 2% (20 MG/ML) 5 ML SYRINGE
INTRAMUSCULAR | Status: DC | PRN
  Administered 2022-02-23: 40 mg via INTRAVENOUS

## 2022-02-23 MED ORDER — HYDROMORPHONE HCL 1 MG/ML IJ SOLN
0.2500 mg | INTRAMUSCULAR | Status: DC | PRN
  Administered 2022-02-23 (×4): 0.5 mg via INTRAVENOUS

## 2022-02-23 MED ORDER — ACETAMINOPHEN 500 MG PO TABS
1000.0000 mg | ORAL_TABLET | Freq: Four times a day (QID) | ORAL | Status: DC
Start: 2022-02-23 — End: 2022-02-24
  Administered 2022-02-23 – 2022-02-24 (×3): 1000 mg via ORAL
  Filled 2022-02-23 (×3): qty 2

## 2022-02-23 MED ORDER — SUGAMMADEX SODIUM 200 MG/2ML IV SOLN
INTRAVENOUS | Status: DC | PRN
  Administered 2022-02-23: 200 mg via INTRAVENOUS

## 2022-02-23 MED ORDER — MIDAZOLAM HCL 2 MG/2ML IJ SOLN
INTRAMUSCULAR | Status: AC
  Administered 2022-02-23: 2 mg via INTRAVENOUS
  Filled 2022-02-23: qty 2

## 2022-02-23 SURGICAL SUPPLY — 49 items
ADH SKN CLS APL DERMABOND .7 (GAUZE/BANDAGES/DRESSINGS) ×1
APL PRP STRL LF DISP 70% ISPRP (MISCELLANEOUS) ×1
APPLIER CLIP 5 13 M/L LIGAMAX5 (MISCELLANEOUS) ×4
APR CLP MED LRG 5 ANG JAW (MISCELLANEOUS) ×2
BAG COUNTER SPONGE SURGICOUNT (BAG) ×3 IMPLANT
BAG SPEC RTRVL 10 TROC 200 (ENDOMECHANICALS) ×1
BAG SPNG CNTER NS LX DISP (BAG) ×1
BIOPATCH RED 1 DISK 7.0 (GAUZE/BANDAGES/DRESSINGS) ×1 IMPLANT
BLADE CLIPPER SURG (BLADE) IMPLANT
CANISTER SUCT 3000ML PPV (MISCELLANEOUS) ×3 IMPLANT
CHLORAPREP W/TINT 26 (MISCELLANEOUS) ×3 IMPLANT
CLIP APPLIE 5 13 M/L LIGAMAX5 (MISCELLANEOUS) ×2 IMPLANT
COVER SURGICAL LIGHT HANDLE (MISCELLANEOUS) ×3 IMPLANT
DERMABOND ADVANCED (GAUZE/BANDAGES/DRESSINGS) ×1
DERMABOND ADVANCED .7 DNX12 (GAUZE/BANDAGES/DRESSINGS) ×2 IMPLANT
DRAIN CHANNEL 19F RND (DRAIN) ×1 IMPLANT
DRSG TEGADERM 4X4.75 (GAUZE/BANDAGES/DRESSINGS) ×1 IMPLANT
ELECT REM PT RETURN 9FT ADLT (ELECTROSURGICAL) ×2
ELECTRODE REM PT RTRN 9FT ADLT (ELECTROSURGICAL) ×2 IMPLANT
EVACUATOR SILICONE 100CC (DRAIN) ×1 IMPLANT
GLOVE BIO SURGEON STRL SZ7 (GLOVE) ×3 IMPLANT
GLOVE BIOGEL PI IND STRL 7.5 (GLOVE) ×2 IMPLANT
GLOVE BIOGEL PI INDICATOR 7.5 (GLOVE) ×1
GOWN STRL REUS W/ TWL LRG LVL3 (GOWN DISPOSABLE) ×6 IMPLANT
GOWN STRL REUS W/TWL LRG LVL3 (GOWN DISPOSABLE) ×6
GRASPER SUT TROCAR 14GX15 (MISCELLANEOUS) ×3 IMPLANT
HEMOSTAT SNOW SURGICEL 2X4 (HEMOSTASIS) ×2 IMPLANT
KIT BASIN OR (CUSTOM PROCEDURE TRAY) ×3 IMPLANT
KIT TURNOVER KIT B (KITS) ×3 IMPLANT
NS IRRIG 1000ML POUR BTL (IV SOLUTION) ×3 IMPLANT
PAD ARMBOARD 7.5X6 YLW CONV (MISCELLANEOUS) ×3 IMPLANT
POUCH RETRIEVAL ECOSAC 10 (ENDOMECHANICALS) ×2 IMPLANT
POUCH RETRIEVAL ECOSAC 10MM (ENDOMECHANICALS) ×2
SCISSORS LAP 5X35 DISP (ENDOMECHANICALS) ×3 IMPLANT
SET IRRIG TUBING LAPAROSCOPIC (IRRIGATION / IRRIGATOR) ×3 IMPLANT
SET TUBE SMOKE EVAC HIGH FLOW (TUBING) ×3 IMPLANT
SHEARS 1100 HARMONIC 36 (ELECTROSURGICAL) ×1 IMPLANT
SLEEVE ENDOPATH XCEL 5M (ENDOMECHANICALS) ×6 IMPLANT
SPECIMEN JAR SMALL (MISCELLANEOUS) ×3 IMPLANT
STRIP CLOSURE SKIN 1/2X4 (GAUZE/BANDAGES/DRESSINGS) ×3 IMPLANT
SUT ETHILON 2 0 FS 18 (SUTURE) ×1 IMPLANT
SUT MNCRL AB 4-0 PS2 18 (SUTURE) ×3 IMPLANT
SUT VICRYL 0 UR6 27IN ABS (SUTURE) ×3 IMPLANT
TOWEL GREEN STERILE (TOWEL DISPOSABLE) ×3 IMPLANT
TOWEL GREEN STERILE FF (TOWEL DISPOSABLE) ×3 IMPLANT
TRAY LAPAROSCOPIC MC (CUSTOM PROCEDURE TRAY) ×3 IMPLANT
TROCAR XCEL BLUNT TIP 100MML (ENDOMECHANICALS) ×3 IMPLANT
TROCAR Z-THREAD OPTICAL 5X100M (TROCAR) ×3 IMPLANT
WATER STERILE IRR 1000ML POUR (IV SOLUTION) ×3 IMPLANT

## 2022-02-23 NOTE — Transfer of Care (Signed)
Immediate Anesthesia Transfer of Care Note  Patient: IRWIN TORAN  Procedure(s) Performed: LAPAROSCOPIC CHOLECYSTECTOMY WITH ICG DYE (Abdomen) INDOCYANINE GREEN FLUORESCENCE IMAGING (ICG) (Abdomen)  Patient Location: PACU  Anesthesia Type:GA combined with regional for post-op pain  Level of Consciousness: drowsy, patient cooperative and responds to stimulation  Airway & Oxygen Therapy: Patient Spontanous Breathing  Post-op Assessment: Report given to RN and Post -op Vital signs reviewed and stable  Post vital signs: Reviewed and stable  Last Vitals:  Vitals Value Taken Time  BP 86/57 02/23/22 1517  Temp    Pulse 67 02/23/22 1519  Resp 14 02/23/22 1519  SpO2 92 % 02/23/22 1519  Vitals shown include unvalidated device data.  Last Pain:  Vitals:   02/23/22 1256  TempSrc:   PainSc: 0-No pain         Complications: No notable events documented.

## 2022-02-23 NOTE — Anesthesia Procedure Notes (Signed)
Anesthesia Regional Block: TAP block   Pre-Anesthetic Checklist: , timeout performed,  Correct Patient, Correct Site, Correct Laterality,  Correct Procedure, Correct Position, site marked,  Risks and benefits discussed,  Surgical consent,  Pre-op evaluation,  At surgeon's request and post-op pain management  Laterality: Right  Prep: chloraprep       Needles:  Injection technique: Single-shot  Needle Type: Echogenic Stimulator Needle     Needle Length: 10cm  Needle Gauge: 21   Needle insertion depth: 7 cm   Additional Needles:   Procedures:,,,, ultrasound used (permanent image in chart),,    Narrative:  Start time: 02/23/2022 12:37 PM End time: 02/23/2022 12:42 PM Injection made incrementally with aspirations every 5 mL.  Performed by: Personally  Anesthesiologist: Josephine Igo, MD  Additional Notes: Timeout performed. Patient sedated. Relevant anatomy ID'd using [place]. Incremental 2-50m injection of LA with frequent aspiration. Patient tolerated procedure well.

## 2022-02-23 NOTE — Anesthesia Postprocedure Evaluation (Signed)
Anesthesia Post Note  Patient: Bobby Gamble  Procedure(s) Performed: LAPAROSCOPIC CHOLECYSTECTOMY WITH ICG DYE (Abdomen) INDOCYANINE GREEN FLUORESCENCE IMAGING (ICG) (Abdomen)     Patient location during evaluation: PACU Anesthesia Type: General Level of consciousness: awake and alert and oriented Pain management: pain level controlled Vital Signs Assessment: post-procedure vital signs reviewed and stable Respiratory status: spontaneous breathing, nonlabored ventilation and respiratory function stable Cardiovascular status: blood pressure returned to baseline and stable Postop Assessment: no apparent nausea or vomiting Anesthetic complications: no   No notable events documented.  Last Vitals:  Vitals:   02/23/22 1735 02/23/22 1823  BP: 122/84 114/87  Pulse: 71 77  Resp:  17  Temp:  36.8 C  SpO2: 94% 97%    Last Pain:  Vitals:   02/23/22 1823  TempSrc: Oral  PainSc:                  Bobby Victorio A.

## 2022-02-23 NOTE — Op Note (Addendum)
Preoperative diagnosis: chronic cholecystitis, cirrhosis Postoperative diagnosis: saa Procedure: laparoscopic subtotal cholecystectomy with ICG dye Surgeon: Dr Serita Grammes Anesthesia: general with bilateral TAP block EBL: 250 cc Specimens: gb to pathology Drains 19 Fr Blake drain to gb fossa Complications none Sponge and needle count correct  Dispo recovery stable.  Indications: 33 yom who had placement of percutaneous cholecystostomy tube on 6/23 for acute cholecystitis in setting of cirrhosis with thrombocytopenia and esophageal varices and duration of symptoms.  Perc drain became dislodged and was removed 7/14.  He returned last week with recurrent pain and symptoms of acute cholecystitis. No leukocytosis or elevation in LFTs but US shows gallbladder wall thickening and exam/hx consistent with recurrent cholecystitis. No option to place another drain due to contracted gallbladder.  He was treated with antibiotics and presents today for cholecystectomy.  Procedure: After informed consent was obtained the patient was taken to the operating room.  He was given bilateral tap blocks.  Antibiotics were given.  SCDs were placed.  He was placed under general anesthesia without complication.  He is prepped and draped in the standard sterile surgical fashion.  Surgical timeout was then performed.  I made a vertical incision below his umbilicus.  I grasped the fascia and incised this sharply.  I entered the peritoneum bluntly.  I then placed a 0 Vicryl pursestring suture through the fascia.  A Hassan trocar was introduced and the abdomen was insufflated to 15 mmHg pressure.  I then placed 3 additional 5 mm trocars in the epigastrium and right upper quadrant without difficulty.  He was noted to have nodular cirrhosis.  His liver was very stiff and did not move at all.  He had omentum that was adherent to his gallbladder fossa as well as abdominal wall.  This had some very large vessels.  With some  difficulty I eventually took down the prior site of the percutaneous cholecystostomy so I could get to the gallbladder.  I also had to use the harmonic scalpel to remove the omentum from the liver as well as the abdominal wall.  This was the source of most of the bleeding during the operation.  Once I had done this I identified a contracted white gallbladder.  I grasped this and retracted it cephalad and lateral.  I took this dissection down to where I thought the triangle was.  I did use ICG dye and could see what appeared to be the common duct lower.  I took this down and was getting into a fair amount of varices as well as bleeding that I controlled with clips as well as the harmonic scalpel.  Eventually I realized that due to the inflammation as well as the cirrhosis that there was no way I really could continue farther down towards the common duct.  It was at this time I elected to do a subtotal fenestrated cholecystectomy.  I remove this portion of the gallbladder.  I bovied the remaining mucosa.  I flushed out any debris and stones that were left in the remnant gallbladder.  I did not think opening was an option as this was not going to really improve my visual cessation or the issues with the cirrhosis.  I then obtained hemostasis.  I did place some snow in the gallbladder fossa.  I looked at the omentum again and stopped a couple of things that were bleeding.  I then placed a 33 [place] Blake drain in the right upper quadrant.  This was secured with a 2-0 nylon  suture.  I remove the Hassan trocar and tied my pursestring down.  I placed 2 additional 0 Vicryl sutures to close that defect.  I then remove the trocars and closed everything with 4-0 Monocryl and glue.  He tolerated this well was extubated and transferred to recovery stable.

## 2022-02-23 NOTE — Anesthesia Procedure Notes (Signed)
Procedure Name: Intubation Date/Time: 02/23/2022 1:34 PM  Performed by: Janace Litten, CRNAPre-anesthesia Checklist: Patient identified, Emergency Drugs available, Suction available and Patient being monitored Patient Re-evaluated:Patient Re-evaluated prior to induction Oxygen Delivery Method: Circle System Utilized Preoxygenation: Pre-oxygenation with 100% oxygen Induction Type: IV induction Ventilation: Mask ventilation without difficulty Laryngoscope Size: Mac and 4 Grade View: Grade I Tube type: Oral Tube size: 7.5 mm Number of attempts: 1 Airway Equipment and Method: Stylet Placement Confirmation: ETT inserted through vocal cords under direct vision, positive ETCO2 and breath sounds checked- equal and bilateral Secured at: 23 cm Tube secured with: Tape Dental Injury: Teeth and Oropharynx as per pre-operative assessment

## 2022-02-23 NOTE — Discharge Instructions (Signed)
[facility] Office Phone Number [phone]  POST OP INSTRUCTIONS Take 400 mg of ibuprofen every 8 hours or 650 mg tylenol every 6 hours for next 72 hours then as needed. Use ice several times daily also.  A prescription for pain medication may be given to you upon discharge.  Take your pain medication as prescribed, if needed.  If narcotic pain medicine is not needed, then you may take acetaminophen (Tylenol), naprosyn (Alleve) or ibuprofen (Advil) as needed. Take your usually prescribed medications unless otherwise directed If you need a refill on your pain medication, please contact your pharmacy.  They will contact our office to request authorization.  Prescriptions will not be filled after 5pm or on week-ends. You should eat very light the first 24 hours after surgery, such as soup, crackers, pudding, etc.  Resume your normal diet the day after surgery. Most patients will experience some swelling and bruising in the breast.  Ice packs and a good support bra will help.  Wear the breast binder provided or a sports bra for 72 hours day and night.  After that wear a sports bra during the day until you return to the office. Swelling and bruising can take several days to resolve.  It is common to experience some constipation if taking pain medication after surgery.  Increasing fluid intake and taking a stool softener will usually help or prevent this problem from occurring.  A mild laxative (Milk of Magnesia or Miralax) should be taken according to package directions if there are no bowel movements after 48 hours. I used skin glue on the incision, you may shower in 24 hours.  The glue will flake off over the next 2-3 weeks.  Any sutures or staples will be removed at the office during your follow-up visit. ACTIVITIES:  You may resume regular daily activities (gradually increasing) beginning the next day.  Wearing a good support bra or sports bra minimizes pain and swelling.  You may have  sexual intercourse when it is comfortable. You may drive when you no longer are taking prescription pain medication, you can comfortably wear a seatbelt, and you can safely maneuver your car and apply brakes. RETURN TO WORK:  ______________________________________________________________________________________ You should see your doctor in the office for a follow-up appointment approximately two weeks after your surgery.  Your doctor's nurse will typically make your follow-up appointment when she calls you with your pathology report.  Expect your pathology report 3-4 business days after your surgery.  You may call to check if you do not hear from us after three days. OTHER INSTRUCTIONS: _______________________________________________________________________________________________ _____________________________________________________________________________________________________________________________________ _____________________________________________________________________________________________________________________________________ _____________________________________________________________________________________________________________________________________  WHEN TO CALL DR Roby Spalla: Fever over 101.0 Nausea and/or vomiting. Extreme swelling or bruising. Continued bleeding from incision. Increased pain, redness, or drainage from the incision.  The clinic staff is available to answer your questions during regular business hours.  Please don't hesitate to call and ask to speak to one of the nurses for clinical concerns.  If you have a medical emergency, go to the nearest emergency room or call 911.  A surgeon from [facility] is always on call at the hospital.  For further questions, please visit [email or web address]  

## 2022-02-23 NOTE — Interval H&P Note (Signed)
History and Physical Interval Note:  02/23/2022 11:34 AM Patient well known to me. Had cholecystitis treated with perc chole. This came out and due to contracted gb unable to be replaced. Had another episode treated with abx and now ready for cholecystectomy as he continues to have symptoms.  Bobby Gamble  has presented today for surgery, with the diagnosis of GALLSTONES.  The various methods of treatment have been discussed with the patient and family. After consideration of risks, benefits and other options for treatment, the patient has consented to  Procedure(s): LAPAROSCOPIC CHOLECYSTECTOMY WITH ICG DYE (N/A) INDOCYANINE GREEN FLUORESCENCE IMAGING (ICG) (N/A) as a surgical intervention.  The patient's history has been reviewed, patient examined, no change in status, stable for surgery.  I have reviewed the patient's chart and labs.  Questions were answered to the patient's satisfaction.     Rolm Bookbinder

## 2022-02-23 NOTE — Progress Notes (Signed)
Notified by PACU RN Dewitt Hoes that patient had relatively high volume bloody appearing drain output since arrival to PACU just under 2 hours prior.  Patient feels well.  HR 68, regular BP 120's/80's Drain output about 364m sanguinous but thin fluid, and rate emptying in the bulb has slowed significantly at time of my eval.  Suspect combination of oozing/old blood and irrigation, given nodular cirrhosis/ evidence of portal hypertension noted intraop with bleeding from omentum as well as varices around the proximal gallbladder.   Continue close observation, at this point. AM CBC, sooner if clinical picture changes or drain output increases again.

## 2022-02-23 NOTE — Anesthesia Procedure Notes (Signed)
Anesthesia Regional Block: TAP block   Pre-Anesthetic Checklist: , timeout performed,  Correct Patient, Correct Site, Correct Laterality,  Correct Procedure, Correct Position, site marked,  Risks and benefits discussed,  Surgical consent,  Pre-op evaluation,  At surgeon's request and post-op pain management  Laterality: Left  Prep: chloraprep       Needles:  Injection technique: Single-shot  Needle Type: Echogenic Stimulator Needle     Needle Length: 10cm  Needle Gauge: 21   Needle insertion depth: 7 cm   Additional Needles:   Procedures:,,,, ultrasound used (permanent image in chart),,    Narrative:  Start time: 02/23/2022 12:42 PM End time: 02/23/2022 12:47 PM Injection made incrementally with aspirations every 5 mL.  Performed by: Personally  Anesthesiologist: Josephine Igo, MD  Additional Notes: Timeout performed. Patient sedated. Relevant anatomy ID'd using [place]. Incremental 2-87m injection of LA with frequent aspiration. Patient tolerated procedure well.

## 2022-02-23 NOTE — Anesthesia Preprocedure Evaluation (Signed)
Anesthesia Evaluation  Patient identified by MRN, date of birth, ID band Patient awake    Reviewed: Allergy & Precautions, NPO status , Patient's Chart, lab work & pertinent test results, reviewed documented beta blocker date and time   Airway Mallampati: II  TM Distance: >3 FB Neck ROM: Full    Dental no notable dental hx. (+) Dental Advisory Given   Pulmonary former smoker,    Pulmonary exam normal breath sounds clear to auscultation       Cardiovascular hypertension, Pt. on medications and Pt. on home beta blockers Normal cardiovascular exam Rhythm:Regular Rate:Normal  EKG 12/05/21 NSR, 1st Deg AV Block, prolonged QTc   Neuro/Psych  Headaches, PSYCHIATRIC DISORDERS Anxiety Depression    GI/Hepatic GERD  Medicated,(+) Cirrhosis     substance abuse  alcohol use, Cholelithiasis with acute cholecystitis   Endo/Other  Gout  Renal/GU Renal InsufficiencyRenal disease   Hx/o Prostate Ca S/P Prostatectomy    Musculoskeletal  (+) Arthritis , Osteoarthritis,    Abdominal   Peds  Hematology  (+) Blood dyscrasia, anemia , Thrombocytopenia   Anesthesia Other Findings   Reproductive/Obstetrics                             Anesthesia Physical Anesthesia Plan  ASA: 3  Anesthesia Plan: General   Post-op Pain Management: Minimal or no pain anticipated and Regional block*   Induction: Intravenous, Rapid sequence and Cricoid pressure planned  PONV Risk Score and Plan: 4 or greater and Treatment may vary due to age or medical condition, Midazolam, Ondansetron and Dexamethasone  Airway Management Planned: Oral ETT  Additional Equipment: None  Intra-op Plan:   Post-operative Plan: Extubation in OR  Informed Consent: I have reviewed the patients History and Physical, chart, labs and discussed the procedure including the risks, benefits and alternatives for the proposed anesthesia with the patient  or authorized representative who has indicated his/her understanding and acceptance.     Dental advisory given  Plan Discussed with: CRNA and Anesthesiologist  Anesthesia Plan Comments:         Anesthesia Quick Evaluation

## 2022-02-24 ENCOUNTER — Encounter (HOSPITAL_COMMUNITY): Payer: Self-pay | Admitting: General Surgery

## 2022-02-24 LAB — COMPREHENSIVE METABOLIC PANEL
ALT: 38 U/L (ref 0–44)
AST: 43 U/L — ABNORMAL HIGH (ref 15–41)
Albumin: 3.3 g/dL — ABNORMAL LOW (ref 3.5–5.0)
Alkaline Phosphatase: 59 U/L (ref 38–126)
Anion gap: 9 (ref 5–15)
BUN: 15 mg/dL (ref 8–23)
CO2: 24 mmol/L (ref 22–32)
Calcium: 9.1 mg/dL (ref 8.9–10.3)
Chloride: 105 mmol/L (ref 98–111)
Creatinine, Ser: 1.28 mg/dL — ABNORMAL HIGH (ref 0.61–1.24)
GFR, Estimated: >60 mL/min (ref >60)
Glucose, Bld: 214 mg/dL — ABNORMAL HIGH (ref 70–99)
Potassium: 4.3 mmol/L (ref 3.5–5.1)
Sodium: 138 mmol/L (ref 135–145)
Total Bilirubin: 1.2 mg/dL (ref 0.3–1.2)
Total Protein: 6.6 g/dL (ref 6.5–8.1)

## 2022-02-24 LAB — CBC
HCT: 33 % — ABNORMAL LOW (ref 39.0–52.0)
Hemoglobin: 11.9 g/dL — ABNORMAL LOW (ref 13.0–17.0)
MCH: 34.6 pg — ABNORMAL HIGH (ref 26.0–34.0)
MCHC: 36.1 g/dL — ABNORMAL HIGH (ref 30.0–36.0)
MCV: 95.9 fL (ref 80.0–100.0)
Platelets: 142 10*3/uL — ABNORMAL LOW (ref 150–400)
RBC: 3.44 MIL/uL — ABNORMAL LOW (ref 4.22–5.81)
RDW: 13.6 % (ref 11.5–15.5)
WBC: 11.1 10*3/uL — ABNORMAL HIGH (ref 4.0–10.5)
nRBC: 0 % (ref 0.0–0.2)

## 2022-02-24 LAB — SURGICAL PATHOLOGY

## 2022-02-24 MED ORDER — OXYCODONE HCL 5 MG PO TABS: 5.0000 mg | ORAL_TABLET | Freq: Four times a day (QID) | ORAL | 0 refills | Status: AC | PRN

## 2022-02-24 NOTE — Progress Notes (Signed)
1 Day Post-Op   Subjective/Chief Complaint: A lot of drain output, some around tube, tol diet, has been up in room, voiding   Objective: Vital signs in last 24 hours: Temp:  [97.6 F (36.4 C)-98.6 F (37 C)] 97.8 F (36.6 C) (08/02 0744) Pulse Rate:  [60-95] 84 (08/02 0744) Resp:  [10-20] 16 (08/02 0744) BP: (84-141)/(57-93) 128/93 (08/02 0744) SpO2:  [91 %-99 %] 96 % (08/02 0744) Weight:  [72.6 kg] 72.6 kg (08/01 1056) Last BM Date : 02/23/22  Intake/Output from previous day: 08/01 0701 - 08/02 0700 In: 2030 [P.O.:480; I.V.:1200; IV Piggyback:350] Out: 1890 [Urine:900; Drains:915; Blood:75] Intake/Output this shift: No intake/output data recorded.  General nad Cv regular Pulm effort normal Ab soft approp tender drain serosang incisions clean   Lab Results:  Recent Labs    02/24/22 0406  WBC 11.1*  HGB 11.9*  HCT 33.0*  PLT 142*   BMET Recent Labs    02/24/22 0406  NA 138  K 4.3  CL 105  CO2 24  GLUCOSE 214*  BUN 15  CREATININE 1.28*  CALCIUM 9.1   PT/INR No results for input(s): "LABPROT", "INR" in the last 72 hours. ABG No results for input(s): "PHART", "HCO3" in the last 72 hours.  Invalid input(s): "PCO2", "PO2"  Studies/Results: No results found.  Anti-infectives: Anti-infectives (From admission, onward)    Start     Dose/Rate Route Frequency Ordered Stop   02/23/22 1142  ceFAZolin (ANCEF) 2-4 GM/100ML-% IVPB       Note to Pharmacy: Nyoka Cowden D: cabinet override      02/23/22 1142 02/23/22 1344   02/23/22 1115  ceFAZolin (ANCEF) IVPB 2g/100 mL premix        2 g 200 mL/hr over 30 Minutes Intravenous On call to O.R. 02/23/22 1110 02/23/22 1336       Assessment/Plan: POD 1 lap subtotal chole Cirrhosis -drain output is a lot of irrigation and bloody fluid from surgery, hct stable this am, I do not think he is bleeding currently -regular diet -change drain dressing -possibly home later today -cr is stable and will recheck as  outpatient -lfts are as expected -no bile in drain- discussed risk of leak requiring ercp as well as small risk recurrent cholecystitis -will decide on dispo midday  Rolm Bookbinder 02/24/2022

## 2022-02-24 NOTE — Progress Notes (Signed)
Mobility Specialist - Progress Note   02/24/22 1130  Mobility  Activity Ambulated independently in hallway  Level of Assistance Independent  Assistive Device None  Distance Ambulated (ft) 450 ft  Activity Response Tolerated well  $Mobility charge 1 Mobility   Pt received ambulating in hallway. No c/o pain. Pt back in room with all needs met.    Larey Seat

## 2022-02-24 NOTE — Progress Notes (Signed)
Pt discharged to home accompanied by wife. All DC instructions reviewed and copy given. Dressing supplies given to pt and they were instructed on how to change the dressing and empty the JP drain and how to carefully strip the drain line. Chart given to chart the JP drainage and take to Dr. Donne Hazel on Monday at the follow up appt.

## 2022-02-24 NOTE — TOC Transition Note (Signed)
Transition of Care [facility]) - CM/SW Discharge Note   Patient Details  Name: Bobby Gamble MRN: [id number] Date of Birth: Mar 26, 1956  [facility] CM/SW Contact:  Carles Collet, RN Phone Number: 02/24/2022, 12:02 PM   Clinical Narrative:   Unable to reach patient. Spoke w his wife over the phone. She confirms that he is uninsured.  He has PCP, added reminder to AVS to follow up. They are self pay, as he is self employed. Wife states there are no barriers to affording meds and they will pay out of pocket at DC. Declined additional resources    Final next level of care: Home/Self Care Barriers to Discharge: No Barriers Identified   Patient Goals and CMS Choice Patient states their goals for this hospitalization and ongoing recovery are:: to go home      Discharge Placement                       Discharge Plan and Services                DME Arranged: N/A         HH Arranged: NA          Social Determinants of Health (SDOH) Interventions     Readmission Risk Interventions     No data to display

## 2022-02-24 NOTE — Progress Notes (Signed)
Patient  had moderate bleeding to surgical site and complaining of severe pain,On call provider notified and orders given,to apply pressure dressing,patient medicated for pain as needed.

## 2022-02-25 NOTE — Discharge Summary (Signed)
Physician Discharge Summary  Patient ID: Bobby Gamble MRN: [id number] DOB/AGE: 1955/10/10 66 y.o.  Admit date: 02/23/2022 Discharge date: 02/25/2022  Admission Diagnoses: Cirrhosis Htn Cholecystitis  Discharge Diagnoses:  Principal Problem:   S/P laparoscopic cholecystectomy (Fenestrated subtotal)  Discharged Condition: good  Hospital Course: This is a 66 year old male with cirrhosis, hypertension who had cholecystitis initially managed with a percutaneous cholecystostomy.  This was dislodged and removed.  The gallbladder was contracted and cannot be replaced.  He has been treated with antibiotics 1 more time as an inpatient and I discussed with him just going to the operating room and taken his gallbladder out.  I took him to the operating room and did a very difficult laparoscopic subtotal cholecystectomy.  He had a lot of collaterals due to his cirrhosis as well as a nodular liver consistent with cirrhosis as well.  His gallbladder was contracted and was unable to really dissect in the porta.  I then ended up doing a fenestrated laparoscopic subtotal cholecystectomy with drain placement.  He recovered well.  The following day he was tolerating his diet, his hemoglobin was stable, and the drain was nonbilious.  He will be discharged home.  Consults: None  Significant Diagnostic Studies: none  Treatments: surgery: lap subtotal cholecystectomy  Discharge Exam: Blood pressure 130/84, pulse 72, temperature 98.2 F (36.8 C), temperature source Oral, resp. rate 18, height '5\' 7"'$  (1.702 m), weight 72.6 kg, SpO2 98 %. Abd: soft approp tender drain serosang  Disposition: Discharge disposition: 01-Home or Self Care        Allergies as of 02/24/2022       Reactions   Atorvastatin Other (See Comments)   Joints ache   Simvastatin Other (See Comments)   Muscle aches   Codeine Nausea And Vomiting, Anxiety, Other (See Comments)   Made the patient feel sick and nervous         Medication List     STOP taking these medications    amoxicillin-clavulanate 875-125 MG tablet Commonly known as: AUGMENTIN       TAKE these medications    acetaminophen 500 MG tablet Commonly known as: TYLENOL Take 1 tablet (500 mg total) by mouth every 8 (eight) hours as needed for mild pain or fever.   ALPRAZolam 0.5 MG tablet Commonly known as: XANAX Take 0.5 mg by mouth daily as needed for anxiety.   Cinnamon 500 MG capsule Take 2,000 mg by mouth in the morning.   esomeprazole 20 MG capsule Commonly known as: NEXIUM Take 40 mg by mouth in the morning and at bedtime.   fexofenadine 180 MG tablet Commonly known as: ALLEGRA Take 180 mg by mouth daily.   Fish Oil 1200 MG Caps Take 1,200 mg by mouth daily with breakfast.   fluticasone 50 MCG/ACT nasal spray Commonly known as: FLONASE Place 2 sprays into both nostrils daily as needed for allergies.   hydrOXYzine 25 MG tablet Commonly known as: ATARAX Take 50 mg by mouth at bedtime.   ibuprofen 200 MG tablet Commonly known as: ADVIL Take 400 mg by mouth every 6 (six) hours as needed for mild pain or headache.   losartan 100 MG tablet Commonly known as: COZAAR Take 50 mg by mouth at bedtime.   methocarbamol 500 MG tablet Commonly known as: ROBAXIN Take 1 tablet (500 mg total) by mouth 2 (two) times daily. What changed:  when to take this reasons to take this   metoprolol succinate 25 MG 24 hr tablet Commonly known as: TOPROL-XL  Take 12.5 mg by mouth at bedtime.   mupirocin ointment 2 % Commonly known as: BACTROBAN Apply 1 application  topically daily as needed (for breakouts on the nose).   oxyCODONE 5 MG immediate release tablet Commonly known as: Oxy IR/ROXICODONE Take 1 tablet (5 mg total) by mouth every 6 (six) hours as needed for moderate pain or severe pain. What changed: Another medication with the same name was added. Make sure you understand how and when to take each.   oxyCODONE 5 MG  immediate release tablet Commonly known as: Oxy IR/ROXICODONE Take 1 tablet (5 mg total) by mouth every 6 (six) hours as needed for moderate pain, severe pain or breakthrough pain. What changed: You were already taking a medication with the same name, and this prescription was added. Make sure you understand how and when to take each.   sucralfate 1 g tablet Commonly known as: CARAFATE Take 1 g by mouth 2 (two) times daily before a meal.   Vitamin D3 50 MCG (2000 UT) Tabs Take 4,000 Units by mouth every morning.        Follow-up Information     Rolm Bookbinder, MD Follow up in 1 week(s).   Specialty: General Surgery Contact information: [address] [place] [address] [phone]         Merrilee Seashore, MD Follow up.   Specialty: Internal Medicine Why: Schedule hospital follow up with PCP Contact information: [address] [place] [address] [phone]                 Signed: Rolm Bookbinder 02/25/2022, 11:33 AM

## 2022-02-26 ENCOUNTER — Other Ambulatory Visit: Payer: Self-pay

## 2023-01-07 ENCOUNTER — Other Ambulatory Visit: Payer: Self-pay | Admitting: Gastroenterology

## 2023-02-23 ENCOUNTER — Other Ambulatory Visit: Payer: Self-pay

## 2023-02-23 ENCOUNTER — Encounter (HOSPITAL_COMMUNITY): Payer: Self-pay | Admitting: Gastroenterology

## 2023-02-23 NOTE — Progress Notes (Addendum)
Per pt wife handles medications. Called her to confirm med list and gave list of AM meds to her.  COVID Vaccine Completed: yes  Date of COVID positive in last 90 days: no  PCP - Georgianne Fick, MD Cardiologist - n/a  Chest x-ray - n/a EKG - n/a Stress Test - n/a ECHO - n/a Cardiac Cath - n/a Pacemaker/ICD device last checked: n/a Spinal Cord Stimulator: n/a  Bowel Prep - clear day before, miralax  Sleep Study - n/a CPAP -   Fasting Blood Sugar - pre DM Checks Blood Sugar no checks at home  Last dose of GLP1 agonist-  N/A GLP1 instructions:  N/A   Last dose of SGLT-2 inhibitors-  N/A SGLT-2 instructions: N/A   Blood Thinner Instructions: n/a Aspirin Instructions: Last Dose:  Activity level: Can go up a flight of stairs and perform activities of daily living without stopping and without symptoms of chest pain or shortness of breath.   Anesthesia review:   Patient denies shortness of breath, fever, cough and chest pain at PAT appointment  Patient verbalized understanding of instructions that were given to them

## 2023-03-04 NOTE — H&P (Signed)
History of Present Illness    General:  67 year old male with history of alcohol related cirrhosis, last seen in office on 12/10/2021.  Workup for chronic liver disease was negative EGD 01/05/2022: Grade 1 esophageal varices, repeat EGD recommended in 3 years Status postcholecystectomy on 02/23/2022 EGD 7/22: Grade 2 esophageal varices, portal hypertensive gastropathy, recommended to start beta-blocker Colonoscopy, Dr. Kinnie Scales, 05/2019: 6 polyps removed, AVM rectum, large internal hemorrhoids Labs from 8/23 showed sodium 138, BUN/creatinine/GFR 15/1.28/more than 60, PT/INR 15.8/1.3, hemoglobin 11.9, MCV 95.9, platelet 142, T. bili/AST/ALT/ALP of 1.2/43/38/59 CT abdomen and pelvis with contrast 5/23: Cirrhosis, splenomegaly, portal hypertension with progressive gastric and esophageal varices  alpha-fetoprotein was elevated at 7.4 in 5/22 He is doing well. Denies nauesa or vomiting. He takes nexium 40 mg twice a day and it control acid reflux and heartburn. He has 1-3 Bms a day, denies blood in stool or black stools. Denies abdominal or rectal pain. He has a good appetite and denies unintentional weight loss. Last alcohol use for at least 1 year.  Current Medications Taking Glimepiride 1 MG Tablet 1 tablet with breakfast or the first main meal of the day Orally Once a day NexIUM(Esomeprazole Magnesium) 40 MG Packet 1 capsule Orally Once a day , Notes to Pharmacist: twice a day Allegra Allergy(Fexofenadine HCl) 180 MG Tablet 1 tablet Orally every morning Cinnamon 500 MG Capsule as directed Orally every morning Vitamin D3 125 MCG (5000 UT) Tablet as directed Orally Fish Oil 1000 MG Capsule 1 capsule Orally Once a day Losartan Potassium 100 MG Tablet 1 tablet Orally Once a day Mupirocin 2 % Ointment 1 application Externally as needed Acyclovir 400 mg tablet one tab orally as needed Hyoscyamine Sulfate 0.125 MG Tablet 1 tablet as needed Orally every 4 hrs hydrOXYzine HCl 25 MG Tablet 1 tablet  at bedtime as needed Orally Once a day ALPRAZolam 0.5 MG Tablet 1 tablet Orally as needed Not-Taking Doxycycline Hyclate 100 MG Capsule TAKE 1 CAPSULE BY MOUTH TWICE A DAY FOR 7 DAYS Oral Discontinued Sucralfate 1 GM Tablet TAKE 1 TABLET BY MOUTH TWICE A DAY ON EMPTY STOMACH Allopurinol 300 MG Tablet 1 tablet Orally Once a day Medication List reviewed and reconciled with the patient Past Medical History Alcoholic Cirrhosis of liver without ascites. Secondary Esophageal Varices without bleeding. Prediabetes. Colon polyps. Internal hemorrhoids. T2DM. HTN. Insomnia. Prostate Cancer. Hyperlipidemia. Chronic Renal Failure. Hypothyroidism. Gout. Surgical History Colonoscopy 06/12/2019 Endoscopy 06/12/2019 prostatectomy cholecystectomy 02/2022 Family History Father: deceased, liver dz Mother: deceased, diagnosed with Diabetes No family history of colon cancer or polyps. Social History    General:  Tobacco use  cigarettes: Former smoker Quit in year 1980 Tobacco history last updated 01/03/2023 Alcohol: no. Recreational drug use: no. Allergies Codeine: weakness - Allergy Atorvastatin: weakness - Allergy Hydrocodone: nausea - Allergy Hospitalization/Major Diagnostic Procedure ABD pain 11/2021 None in the past year 12/2022 Review of Systems    GI PROCEDURE:  Pacemaker/ AICD no.  Artificial heart valves no.  MI/heart attack no.  Abnormal heart rhythm no.  Angina no.  CVA no.  Hypertension  YES.  Hypotension no.  Asthma, COPD no.  Sleep apnea no.  Seizure disorders no.  Artificial joints no.  Diabetes YES, type II.  Significant headaches no.  Vertigo no.  Depression/anxiety no.  Abnormal bleeding no.  Kidney Disease no.  Liver disease YES.  Blood transfusion no  Vital Signs Wt: 176.2, Wt change: 3.6 lbs, Ht: 66.5, BMI: 28.01, Temp: 98.6, Pulse sitting: 73, BP sitting: 138/89. Examination  Gastroenterology:: GENERAL APPEARANCE:  Well developed, well  nourished, no active distress, pleasant.  SCLERA:  anicteric.  CARDIOVASCULAR  Normal RRR .  RESPIRATORY  Breath sounds normal. Respiration even and unlabored.  ABDOMEN  No masses palpated. Liver and spleen not palpated, normal. Bowel sounds normal, Abdomen not distended.  EXTREMITIES:  No edema.  NEURO:  alert, oriented to time, place and person, normal gait, no asterisix.  PSYCH:  mood/affect normal.  Assessments 1. Other cirrhosis of liver - K74.69 (Primary) 2. Esophageal varices without bleeding, unspecified esophageal varices type - I85.00 3. History of colon polyps - Z86.010 Treatment 1. Other cirrhosis of liver      LAB: PT (Prothrombin Time) ([id number]) (Collection Date & Time - 01/03/2023 09:10 AM)   LAB: CBC without Diff (Collection Date & Time - 01/03/2023 09:10 AM)   LAB: Comp Metabolic Panel (Collection Date & Time - 01/03/2023 09:10 AM)   LAB: AFP, Serum, Tumor Marker ([id number]) (Collection Date & Time - 01/03/2023 09:10 AM)   IMAGING: US ABDOMEN LIMITED RUQ/ASCITES (Ordered for 01/03/2023)     Notes: Will get labs today to calculate MELD sodium score. No evidence of hepatic encephalopathy. No evidence of ascites. He has not had alcohol in over 1 year. Will also get labs for alpha-fetoprotein and ultrasound for HCC screening. Follow-up in 6 months.            Referral To:               Reason:please call pt to schedule [place] of abd limited RUQ in August  Pt will get Medicaid in Aug. 2. Esophageal varices without bleeding, unspecified esophageal varices type      IMAGING: EGD with Band Ligation of Varices (Ordered for 01/03/2023)     Notes: EGD 6/23 had shown grade 1 esophageal varices however CAT scan showed progressively worsening gastric and esophageal varices. Recommend further evaluation with EGD and possible banding of varices.   3. History of colon polyps      IMAGING: Colonoscopy (Ordered for 01/03/2023)     Notes: He had 6 polyps removed in 2020. Recommend  surveillance colonoscopy at the same time as EGD is being performed. The risks and the benefits of the procedure were discussed with the patient in details. The patient understands and verbalizes consent. The patient will be given written instructions, prescription for preparation will be scheduled for the same.   Procedures    Venipuncture:  Venipuncture: Jerel Shepherd 01/03/2023 09:18:38 AM >, performed in right arm, verbal consent obtained, 21 g needle, number of attempts: 1, blood sent to [facility], blood sent to [facility].  Labs   Lab: CBC without Diff                Value Reference Range      WBC 3.6 L 4.0-11.0 - K/ul      RBC 3.78 L 4.20-5.80 - M/uL      HGB 13.1  13.0-17.0 - g/dL      HCT 27.2 L 53.6-64.4 - %      MCV 98.5 H 80.0-94.0 - fL      MCH 34.6 H 27.0-33.0 - pg      MCHC 35.1  32.0-36.0 - g/dL      RDW 03.4  74.2-59.5 - %      PLT 70 L 150-400 - K/uL Notes: ,  01/03/2023 11:39:44 AM i> normal hemoglobin, low platelet compatible with cirrhosis, reassurance. MCV slightly h gh at 98.5, recommend starting folic acid 1  mg p.o. daily, vitamin B12 1000 mcg p.o. daily, please send prescription for the same for 90 days with 3 refills This lab was reviewed by Jamesetta Geralds on 01/03/2023 at 15:31 PM EDT Lab: Comp Metabolic Panel                Value Reference Range      GLUCOSE 329 H 70-99 - mg/dL      BUN 20  4-01 - mg/dL      CREATININE 0.27 H 0.60-1.30 - mg/dl      eGFR 56 L >25 - calc      SODIUM 140  136-145 - mmol/L      POTASSIUM 4.3  3.5-5.5 - mmol/L      CHLORIDE 103  98-107 - mmol/L      C02 28  22-32 - mmol/L      ANION GAP 13.2  6.0-20.0 - mmol/L      CALCIUM 9.8  8.6-10.3 - mg/dL      CA-[place]b corrected 3.66  8.60-10.30 - mg/dL      T PROTEIN 6.4  4.4-0.3 - g/dL      ALBUMIN 3.8  4.7-4.2 - g/dL      T.BILI 1.2 H 0.3-1.0 - mg/dL      ALP 595  63-875 - U/L      AST 23  0-39 - U/L      ALT 24  0-52 - U/L Notes: Kerin Salen 01/03/2023 11:39:14 AM >  blood sugar very high at 329, elevated creatinine 1.4, low GFR 56, recommend nephrology evaluation, refer for the same if needed, normal LFTs This lab was reviewed by Jamesetta Geralds on 01/03/2023 at 15:31 PM EDT Lab: PT (Prothrombin Time) ([id number])                Value Reference Range      Prothrombin Time 12.4 H 9.1-12.0 - sec      INR 1.2  0.9-1.2 - Notes: ,  01/04/2023 08:16:43 AM > minimally elevated PT of 12.4, otherwise normal INR, reassurance, repeat in 6 months Tadili, Hajar 01/07/2023 10:01:32 AM > spoke to pt and informed them of results and recommendations pt voiced understanding. This lab was reviewed by Jamesetta Geralds on 01/07/2023 at 10:43 AM EDT Lab: AFP, Serum, Tumor Marker ([id number])                Value Reference Range      AFP, Serum, Tumor Marker 6.1  0.0-8.4 - ng/mL Notes: Kerin Salen 01/04/2023 08:16:27 AM > normal tumor marker for liver cancer, reassurance, repeat in 6 months Tadili, Hajar 01/07/2023 10:01:32 AM > spoke to pt and informed them of results and recommendations pt voiced understanding. This lab was reviewed by Jamesetta Geralds on 01/07/2023 at 10:44 AM EDT

## 2023-03-07 ENCOUNTER — Ambulatory Visit (HOSPITAL_COMMUNITY): Payer: Medicare Other | Admitting: Anesthesiology

## 2023-03-07 ENCOUNTER — Ambulatory Visit (HOSPITAL_COMMUNITY)
Admission: RE | Admit: 2023-03-07 | Discharge: 2023-03-07 | Disposition: A | Discharge status: Home / Self Care | Payer: Medicare Other | Source: Ambulatory Visit | Attending: Gastroenterology | Admitting: Gastroenterology

## 2023-03-07 ENCOUNTER — Encounter (HOSPITAL_COMMUNITY)
Admission: RE | Disposition: A | Discharge status: Home / Self Care | Payer: Self-pay | Source: Ambulatory Visit | Attending: Gastroenterology

## 2023-03-07 ENCOUNTER — Other Ambulatory Visit: Payer: Self-pay

## 2023-03-07 ENCOUNTER — Ambulatory Visit (HOSPITAL_BASED_OUTPATIENT_CLINIC_OR_DEPARTMENT_OTHER): Payer: Medicare Other | Admitting: Anesthesiology

## 2023-03-07 ENCOUNTER — Encounter (HOSPITAL_COMMUNITY): Payer: Self-pay | Admitting: Gastroenterology

## 2023-03-07 DIAGNOSIS — Z1211 Encounter for screening for malignant neoplasm of colon: Secondary | ICD-10-CM | POA: Insufficient documentation

## 2023-03-07 DIAGNOSIS — D120 Benign neoplasm of cecum: Secondary | ICD-10-CM | POA: Insufficient documentation

## 2023-03-07 DIAGNOSIS — N189 Chronic kidney disease, unspecified: Secondary | ICD-10-CM | POA: Diagnosis not present

## 2023-03-07 DIAGNOSIS — K7030 Alcoholic cirrhosis of liver without ascites: Secondary | ICD-10-CM | POA: Insufficient documentation

## 2023-03-07 DIAGNOSIS — Z8546 Personal history of malignant neoplasm of prostate: Secondary | ICD-10-CM | POA: Insufficient documentation

## 2023-03-07 DIAGNOSIS — K644 Residual hemorrhoidal skin tags: Secondary | ICD-10-CM | POA: Diagnosis not present

## 2023-03-07 DIAGNOSIS — D123 Benign neoplasm of transverse colon: Secondary | ICD-10-CM | POA: Insufficient documentation

## 2023-03-07 DIAGNOSIS — I8500 Esophageal varices without bleeding: Secondary | ICD-10-CM

## 2023-03-07 DIAGNOSIS — Z87891 Personal history of nicotine dependence: Secondary | ICD-10-CM | POA: Diagnosis not present

## 2023-03-07 DIAGNOSIS — K317 Polyp of stomach and duodenum: Secondary | ICD-10-CM | POA: Insufficient documentation

## 2023-03-07 DIAGNOSIS — I10 Essential (primary) hypertension: Secondary | ICD-10-CM | POA: Diagnosis not present

## 2023-03-07 DIAGNOSIS — I129 Hypertensive chronic kidney disease with stage 1 through stage 4 chronic kidney disease, or unspecified chronic kidney disease: Secondary | ICD-10-CM | POA: Diagnosis not present

## 2023-03-07 DIAGNOSIS — D124 Benign neoplasm of descending colon: Secondary | ICD-10-CM | POA: Insufficient documentation

## 2023-03-07 DIAGNOSIS — K766 Portal hypertension: Secondary | ICD-10-CM | POA: Insufficient documentation

## 2023-03-07 DIAGNOSIS — K648 Other hemorrhoids: Secondary | ICD-10-CM | POA: Insufficient documentation

## 2023-03-07 DIAGNOSIS — K3189 Other diseases of stomach and duodenum: Secondary | ICD-10-CM | POA: Diagnosis not present

## 2023-03-07 DIAGNOSIS — E1122 Type 2 diabetes mellitus with diabetic chronic kidney disease: Secondary | ICD-10-CM | POA: Diagnosis not present

## 2023-03-07 DIAGNOSIS — K219 Gastro-esophageal reflux disease without esophagitis: Secondary | ICD-10-CM | POA: Diagnosis not present

## 2023-03-07 DIAGNOSIS — D122 Benign neoplasm of ascending colon: Secondary | ICD-10-CM | POA: Insufficient documentation

## 2023-03-07 DIAGNOSIS — I8510 Secondary esophageal varices without bleeding: Secondary | ICD-10-CM | POA: Insufficient documentation

## 2023-03-07 DIAGNOSIS — Z86010 Personal history of colonic polyps: Secondary | ICD-10-CM | POA: Diagnosis not present

## 2023-03-07 DIAGNOSIS — K5730 Diverticulosis of large intestine without perforation or abscess without bleeding: Secondary | ICD-10-CM | POA: Insufficient documentation

## 2023-03-07 HISTORY — PX: POLYPECTOMY: SHX5525

## 2023-03-07 HISTORY — PX: COLONOSCOPY WITH PROPOFOL: SHX5780

## 2023-03-07 HISTORY — PX: ESOPHAGEAL BANDING: SHX5518

## 2023-03-07 HISTORY — PX: ESOPHAGOGASTRODUODENOSCOPY (EGD) WITH PROPOFOL: SHX5813

## 2023-03-07 LAB — GLUCOSE, CAPILLARY: Glucose-Capillary: 150 mg/dL — ABNORMAL HIGH (ref 70–99)

## 2023-03-07 SURGERY — ESOPHAGOGASTRODUODENOSCOPY (EGD) WITH PROPOFOL
Anesthesia: Monitor Anesthesia Care

## 2023-03-07 MED ORDER — SODIUM CHLORIDE 0.9 % IV SOLN: INTRAVENOUS | Status: DC

## 2023-03-07 MED ORDER — LACTATED RINGERS IV SOLN: INTRAVENOUS | Status: DC

## 2023-03-07 MED ORDER — PROPOFOL 500 MG/50ML IV EMUL
INTRAVENOUS | Status: DC | PRN
  Administered 2023-03-07: 175 ug/kg/min via INTRAVENOUS

## 2023-03-07 SURGICAL SUPPLY — 25 items

## 2023-03-07 NOTE — Discharge Instructions (Signed)

## 2023-03-07 NOTE — Transfer of Care (Signed)
Immediate Anesthesia Transfer of Care Note  Patient: Bobby Gamble  Procedure(s) Performed: ESOPHAGOGASTRODUODENOSCOPY (EGD) WITH PROPOFOL with banding COLONOSCOPY WITH PROPOFOL ESOPHAGEAL BANDING POLYPECTOMY  Patient Location: PACU  Anesthesia Type:MAC  Level of Consciousness: sedated, patient cooperative, and responds to stimulation  Airway & Oxygen Therapy: Patient Spontanous Breathing and Patient connected to face mask oxygen  Post-op Assessment: Report given to RN and Post -op Vital signs reviewed and stable  Post vital signs: Reviewed and stable  Last Vitals:  Vitals Value Taken Time  BP    Temp    Pulse 82 03/07/23 1042  Resp 13 03/07/23 1042  SpO2 96 % 03/07/23 1042  Vitals shown include unfiled device data.  Last Pain:  Vitals:   03/07/23 0835  TempSrc: Temporal  PainSc: 0-No pain         Complications: No notable events documented.

## 2023-03-07 NOTE — Interval H&P Note (Signed)
History and Physical Interval Note: 66/male with cirrhosis and esophageal varices for EGD with banding with propofol. 03/07/2023 9:02 AM  Bobby Gamble  has presented today for EGD with banding with the diagnosis of esophageal varices .  The various methods of treatment have been discussed with the patient and family. After consideration of risks, benefits and other options for treatment, the patient has consented to  Procedure(s): ESOPHAGOGASTRODUODENOSCOPY (EGD) WITH PROPOFOL with banding (N/A) COLONOSCOPY WITH PROPOFOL (N/A) as a surgical intervention.  The patient's history has been reviewed, patient examined, no change in status, stable for surgery.  I have reviewed the patient's chart and labs.  Questions were answered to the patient's satisfaction.     Kerin Salen

## 2023-03-07 NOTE — Op Note (Signed)
[facility] Patient Name: Bobby Gamble Procedure Date: 03/07/2023 MRN: [id number] Attending MD: Kerin Salen , MD, [id number] Date of Birth: 03/22/1956 CSN: [id number] Age: 67 Admit Type: Outpatient Procedure:                Colonoscopy Indications:              High risk colon cancer surveillance: Personal                            history of colonic polyps, Last colonoscopy: 2020 Providers:                Kerin Salen, MD, Cephus Richer, RN, Norman Clay, RN,                            Rozetta Nunnery, Technician Referring MD:             Stephenie Acres Medicines:                See the Anesthesia note for documentation of the                            administered medications Complications:            No immediate complications. Estimated blood loss:                            Minimal. Estimated Blood Loss:     Estimated blood loss was minimal. Procedure:                Pre-Anesthesia Assessment:                           - Prior to the procedure, a History and Physical                            was performed, and patient medications and                            allergies were reviewed. The patient's tolerance of                            previous anesthesia was also reviewed. The risks                            and benefits of the procedure and the sedation                            options and risks were discussed with the patient.                            All questions were answered, and informed consent                            was obtained. Prior Anticoagulants: The patient has  taken no anticoagulant or antiplatelet agents. ASA                            Grade Assessment: III - A patient with severe                            systemic disease. After reviewing the risks and                            benefits, the patient was deemed in satisfactory                            condition to undergo the procedure.                            After obtaining informed consent, the colonoscope                            was passed under direct vision. Throughout the                            procedure, the patient's blood pressure, pulse, and                            oxygen saturations were monitored continuously. The                            [id number]-HQ190L ([id number]) Olympus colonoscope was                            introduced through the anus and advanced to the the                            terminal ileum. The colonoscopy was performed                            without difficulty. The patient tolerated the                            procedure well. The quality of the bowel                            preparation was adequate to identify polyps greater                            than 5 mm in size and fair. The terminal ileum,                            ileocecal valve, appendiceal orifice, and rectum                            were photographed. Scope In: 10:19:24 AM Scope Out: 10:38:40 AM Scope Withdrawal Time: 0 hours 15 minutes 55 seconds  Total Procedure Duration: 0 hours  19 minutes 16 seconds  Findings:      Hemorrhoids were found on perianal exam.      Skin tags were found on perianal exam.      The terminal ileum appeared normal.      Five sessile polyps were found in the descending colon, transverse       colon, ascending colon and cecum. The polyps were 4 to 6 mm in size.       These polyps were removed with a cold snare. Resection and retrieval       were complete.      Scattered medium-mouthed and small-mouthed diverticula were found in the       sigmoid colon, descending colon, transverse colon and hepatic flexure.      Non-bleeding internal hemorrhoids were found during retroflexion. Impression:               - Preparation of the colon was fair.                           - Hemorrhoids found on perianal exam.                           - Perianal skin tags found on perianal exam.                            - The examined portion of the ileum was normal.                           - Five 4 to 6 mm polyps in the descending colon, in                            the transverse colon, in the ascending colon and in                            the cecum, removed with a cold snare. Resected and                            retrieved.                           - Diverticulosis in the sigmoid colon, in the                            descending colon, in the transverse colon and at                            the hepatic flexure.                           - Non-bleeding internal hemorrhoids. Moderate Sedation:      Patient did not receive moderate sedation for this procedure, but       instead received monitored anesthesia care. Recommendation:           - Patient has a contact number available for  emergencies. The signs and symptoms of potential                            delayed complications were discussed with the                            patient. Return to normal activities tomorrow.                            Written discharge instructions were provided to the                            patient.                           - High fiber diet.                           - Continue present medications.                           - Await pathology results.                           - Repeat colonoscopy for surveillance based on                            pathology results. Procedure Code(s):        --- Professional ---                           [phone], Colonoscopy, flexible; with removal of                            tumor(s), polyp(s), or other lesion(s) by snare                            technique Diagnosis Code(s):        --- Professional ---                           Z86.010, Personal history of colonic polyps                           K64.8, Other hemorrhoids                           D12.4, Benign neoplasm of descending colon                           D12.3,  Benign neoplasm of transverse colon (hepatic                            flexure or splenic flexure)                           D12.2, Benign neoplasm of ascending colon  D12.0, Benign neoplasm of cecum                           K64.4, Residual hemorrhoidal skin tags                           K57.30, Diverticulosis of large intestine without                            perforation or abscess without bleeding CPT copyright 2022 American Medical Association. All rights reserved. The codes documented in this report are preliminary and upon coder review may  be revised to meet current compliance requirements. Kerin Salen, MD 03/07/2023 10:45:27 AM This report has been signed electronically. Number of Addenda: 0

## 2023-03-07 NOTE — Anesthesia Postprocedure Evaluation (Signed)
Anesthesia Post Note  Patient: Bobby Gamble  Procedure(s) Performed: ESOPHAGOGASTRODUODENOSCOPY (EGD) WITH PROPOFOL with banding COLONOSCOPY WITH PROPOFOL ESOPHAGEAL BANDING POLYPECTOMY     Patient location during evaluation: PACU Anesthesia Type: MAC Level of consciousness: awake and alert Pain management: pain level controlled Vital Signs Assessment: post-procedure vital signs reviewed and stable Respiratory status: spontaneous breathing, nonlabored ventilation, respiratory function stable and patient connected to nasal cannula oxygen Cardiovascular status: stable and blood pressure returned to baseline Postop Assessment: no apparent nausea or vomiting Anesthetic complications: no   No notable events documented.  Last Vitals:  Vitals:   03/07/23 1100 03/07/23 1110  BP: (!) 143/81 (!) 138/93  Pulse: 71 68  Resp: (!) 22 16  Temp:    SpO2: 93% 96%    Last Pain:  Vitals:   03/07/23 1110  TempSrc:   PainSc: 0-No pain                 Earl Lites P 

## 2023-03-07 NOTE — Op Note (Signed)
[facility] Patient Name: Bobby Gamble Procedure Date: 03/07/2023 MRN: [id number] Attending MD: Kerin Salen , MD, [id number] Date of Birth: 1956-07-02 CSN: [id number] Age: 67 Admit Type: Outpatient Procedure:                Upper GI endoscopy Indications:              Follow-up of esophageal varices Providers:                Kerin Salen, MD, Norman Clay, RN, Cephus Richer, RN,                            Rozetta Nunnery, Technician Referring MD:             Stephenie Acres Medicines:                See the Anesthesia note for documentation of the                            administered medications Complications:            No immediate complications. Estimated blood loss:                            Minimal. Estimated Blood Loss:     Estimated blood loss was minimal. Procedure:                After obtaining informed consent, the endoscope was                            passed under direct vision. Throughout the                            procedure, the patient's blood pressure, pulse, and                            oxygen saturations were monitored continuously. The                            [id number]-[id number] ([id number]) Olympus endoscope was introduced                            through the mouth, and advanced to the second part                            of duodenum. Scope In: Scope Out: Findings:      Grade II varices were found in the lower third of the esophagus. They       were small in size. Two bands were successfully placed with complete       eradication, resulting in deflation of varices. There was no bleeding       during and at the end of the procedure.      Multiple medium semi-pedunculated polyps with bleeding and no stigmata       of recent bleeding were found in the entire examined stomach.      Moderate portal hypertensive gastropathy was found in the entire       examined stomach.      The cardia  and gastric fundus were normal on retroflexion.       The examined duodenum was normal. Impression:               - Grade II esophageal varices. Completely                            eradicated. Banded.                           - Multiple gastric polyps.                           - Portal hypertensive gastropathy.                           - Normal examined duodenum.                           - No specimens collected. Moderate Sedation:      Patient did not receive moderate sedation for this procedure, but       instead received monitored anesthesia care. Recommendation:           - Patient has a contact number available for                            emergencies. The signs and symptoms of potential                            delayed complications were discussed with the                            patient. Return to normal activities tomorrow.                            Written discharge instructions were provided to the                            patient.                           - Clear liquid diet for 6 hours and then soft diet                            for the rest of the day.                           - Repeat upper endoscopy in 1 year for surveillance. Procedure Code(s):        --- Professional ---                           [phone], Esophagogastroduodenoscopy, flexible,                            transoral; with band ligation of esophageal/gastric  varices Diagnosis Code(s):        --- Professional ---                           I85.00, Esophageal varices without bleeding                           K31.7, Polyp of stomach and duodenum                           K76.6, Portal hypertension                           K31.89, Other diseases of stomach and duodenum CPT copyright 2022 American Medical Association. All rights reserved. The codes documented in this report are preliminary and upon coder review may  be revised to meet current compliance requirements. Kerin Salen, MD 03/07/2023 10:42:26 AM This report  has been signed electronically. Number of Addenda: 0

## 2023-03-07 NOTE — Interval H&P Note (Signed)
History and Physical Interval Note: 66/male with cirrhosis for esophageal variceal banding and colonoscopy for surveillance.  03/07/2023 9:04 AM  Bobby Gamble  has presented today for Egd with banding and colonoscopy, with the diagnosis of esophageal varices and hx of colon polyps.  The various methods of treatment have been discussed with the patient and family. After consideration of risks, benefits and other options for treatment, the patient has consented to  Procedure(s): ESOPHAGOGASTRODUODENOSCOPY (EGD) WITH PROPOFOL with banding (N/A) COLONOSCOPY WITH PROPOFOL (N/A) as a surgical intervention.  The patient's history has been reviewed, patient examined, no change in status, stable for surgery.  I have reviewed the patient's chart and labs.  Questions were answered to the patient's satisfaction.     Kerin Salen

## 2023-03-07 NOTE — Anesthesia Preprocedure Evaluation (Signed)
Anesthesia Evaluation  Patient identified by MRN, date of birth, ID band Patient awake    Reviewed: Allergy & Precautions, NPO status , Patient's Chart, lab work & pertinent test results  Airway Mallampati: II  TM Distance: >3 FB Neck ROM: Full    Dental no notable dental hx.    Pulmonary neg pulmonary ROS, former smoker   Pulmonary exam normal        Cardiovascular hypertension, Pt. on medications and Pt. on home beta blockers  Rhythm:Regular Rate:Normal     Neuro/Psych  Headaches  Anxiety Depression       GI/Hepatic ,GERD  ,,(+) Cirrhosis   Esophageal Varices      Endo/Other  negative endocrine ROS    Renal/GU negative Renal ROS  negative genitourinary   Musculoskeletal  (+) Arthritis , Osteoarthritis,    Abdominal Normal abdominal exam  (+)   Peds  Hematology Lab Results      Component                Value               Date                      WBC                      11.1 (H)            02/24/2022                HGB                      11.9 (L)            02/24/2022                HCT                      33.0 (L)            02/24/2022                MCV                      95.9                02/24/2022                PLT                      142 (L)             02/24/2022              Anesthesia Other Findings   Reproductive/Obstetrics                             Anesthesia Physical Anesthesia Plan  ASA: 3  Anesthesia Plan: MAC   Post-op Pain Management:    Induction: Intravenous  PONV Risk Score and Plan: 1 and Propofol infusion and Treatment may vary due to age or medical condition  Airway Management Planned: Simple Face Mask and Nasal Cannula  Additional Equipment: None  Intra-op Plan:   Post-operative Plan:   Informed Consent: I have reviewed the patients History and Physical, chart, labs and discussed the procedure including the risks, benefits and  alternatives for the proposed anesthesia with the patient or  authorized representative who has indicated his/her understanding and acceptance.     Dental advisory given  Plan Discussed with: CRNA  Anesthesia Plan Comments:        Anesthesia Quick Evaluation

## 2023-03-14 ENCOUNTER — Encounter (HOSPITAL_COMMUNITY): Payer: Self-pay | Admitting: Gastroenterology

## 2023-08-24 ENCOUNTER — Other Ambulatory Visit: Payer: Self-pay | Admitting: Gastroenterology

## 2023-08-24 DIAGNOSIS — K7030 Alcoholic cirrhosis of liver without ascites: Secondary | ICD-10-CM

## 2023-08-26 ENCOUNTER — Ambulatory Visit
Admission: RE | Admit: 2023-08-26 | Discharge: 2023-08-26 | Disposition: A | Discharge status: Home / Self Care | Payer: Medicare Other | Source: Ambulatory Visit | Attending: Gastroenterology

## 2023-08-26 DIAGNOSIS — K7030 Alcoholic cirrhosis of liver without ascites: Secondary | ICD-10-CM

## 2024-03-02 ENCOUNTER — Other Ambulatory Visit: Payer: Self-pay | Admitting: Gastroenterology

## 2024-03-02 DIAGNOSIS — K7030 Alcoholic cirrhosis of liver without ascites: Secondary | ICD-10-CM

## 2024-03-07 ENCOUNTER — Ambulatory Visit
Admission: RE | Admit: 2024-03-07 | Discharge: 2024-03-07 | Disposition: A | Discharge status: Home / Self Care | Source: Ambulatory Visit | Attending: Gastroenterology | Admitting: Gastroenterology

## 2024-03-07 DIAGNOSIS — K7030 Alcoholic cirrhosis of liver without ascites: Secondary | ICD-10-CM
